# Patient Record
Sex: Female | Born: 1995 | Race: White | Hispanic: No | Marital: Married | Smoking: Never smoker
Health system: Southern US, Community
[De-identification: ages and names within clinical notes are randomized; demographics above are authoritative.]

## PROBLEM LIST (undated history)

## (undated) DIAGNOSIS — R519 Headache, unspecified: Secondary | ICD-10-CM

## (undated) DIAGNOSIS — R002 Palpitations: Secondary | ICD-10-CM

## (undated) HISTORY — PX: NO PAST SURGERIES: SHX2092

## (undated) HISTORY — PX: OTHER SURGICAL HISTORY: SHX169

## (undated) HISTORY — DX: Headache, unspecified: R51.9

## (undated) HISTORY — DX: Palpitations: R00.2

---
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
## 2017-01-16 ENCOUNTER — Ambulatory Visit (INDEPENDENT_AMBULATORY_CARE_PROVIDER_SITE_OTHER): Payer: BLUE CROSS/BLUE SHIELD

## 2017-01-16 ENCOUNTER — Other Ambulatory Visit: Payer: Self-pay

## 2017-01-16 DIAGNOSIS — R000 Tachycardia, unspecified: Secondary | ICD-10-CM

## 2017-01-17 ENCOUNTER — Telehealth: Payer: Self-pay | Admitting: *Deleted

## 2017-01-17 NOTE — Telephone Encounter (Signed)
Notes recorded by Lesle ChrisHill, Prosper Paff G, LPN on 1/19/14788/03/2017 at 3:52 PM EDT Patient notified. Copy to pmd.  -----  Notes recorded by Laqueta LindenKoneswaran, Suresh A, MD on 01/16/2017 at 4:51 PM EDT Normal.

## 2017-01-29 ENCOUNTER — Telehealth: Payer: Self-pay | Admitting: *Deleted

## 2017-01-29 MED ORDER — DILTIAZEM HCL 30 MG PO TABS: 30.0000 mg | ORAL_TABLET | ORAL | 2 refills | Status: DC | PRN

## 2017-01-29 NOTE — Telephone Encounter (Signed)
Notes recorded by Lesle Chris, LPN on 6/80/3212 at 12:53 PM EDT Patient notified. Copy pmd. New medication sent to [facility] now. Follow up already scheduled for 02/04/2017. ------  Notes recorded by Lesle Chris, LPN on 2/48/2500 at 5:34 PM EDT Left message to return call.  ------  Notes recorded by Laqueta Linden, MD on 01/21/2017 at 5:10 PM EDT Start diltiazem 30 mg prn for palpitations.

## 2017-02-04 ENCOUNTER — Ambulatory Visit (INDEPENDENT_AMBULATORY_CARE_PROVIDER_SITE_OTHER): Payer: BLUE CROSS/BLUE SHIELD | Admitting: Cardiovascular Disease

## 2017-02-04 ENCOUNTER — Encounter: Payer: Self-pay | Admitting: Cardiovascular Disease

## 2017-02-04 VITALS — BP 108/75 | HR 108 | Ht 62.0 in | Wt 141.0 lb

## 2017-02-04 DIAGNOSIS — R079 Chest pain, unspecified: Secondary | ICD-10-CM

## 2017-02-04 DIAGNOSIS — R0602 Shortness of breath: Secondary | ICD-10-CM

## 2017-02-04 DIAGNOSIS — R002 Palpitations: Secondary | ICD-10-CM | POA: Diagnosis not present

## 2017-02-04 DIAGNOSIS — R000 Tachycardia, unspecified: Secondary | ICD-10-CM | POA: Diagnosis not present

## 2017-02-04 MED ORDER — DILTIAZEM HCL 30 MG PO TABS: 30.0000 mg | ORAL_TABLET | Freq: Every day | ORAL | Status: DC

## 2017-02-04 NOTE — Progress Notes (Signed)
SUBJECTIVE: The patient returns for follow-up after undergoing cardiovascular testing performed for the evaluation of chest pain and palpitations.  Echocardiography was normal, LVEF 60-65%.  Event monitoring demonstrated sinus tachycardia and sinus rhythm with occasional PVCs. Symptoms correlated with both sinus tachycardia and sinus rhythm.  I then started diltiazem 30 mg as needed for palpitations.  She has yet to begin taking diltiazem she was uncertain as to when to take it. She continues to have left-sided chest pain and sternal and breast tenderness. She denies chest wall and breast discoloration and nipple discharge. She continues to have significant palpitations. She denies leg swelling, lightheadedness, and dizziness.     Review of Systems: As per "subjective", otherwise negative.  Allergies  Allergen Reactions  . Melatonin Itching and Rash    Current Outpatient Prescriptions  Medication Sig Dispense Refill  . diltiazem (CARDIZEM) 30 MG tablet Take 1 tablet (30 mg total) by mouth as needed (palpitations). 30 tablet 2  . ibuprofen (ADVIL,MOTRIN) 200 MG tablet Take 400 mg by mouth every 6 (six) hours as needed.    . mirtazapine (REMERON) 15 MG tablet Take 1 tablet by mouth at bedtime as needed.    . propranolol (INDERAL) 20 MG tablet Take 20-40 mg by mouth daily.      No current facility-administered medications for this visit.     Past Medical History:  Diagnosis Date  . Palpitations     Past Surgical History:  Procedure Laterality Date  . NO PAST SURGERIES      Social History   Social History  . Marital status: Married    Spouse name: N/A  . Number of children: N/A  . Years of education: N/A   Occupational History  . Not on file.   Social History Main Topics  . Smoking status: Never Smoker  . Smokeless tobacco: Never Used  . Alcohol use No  . Drug use: No  . Sexual activity: Not on file   Other Topics Concern  . Not on file   Social  History Narrative  . No narrative on file     Vitals:   02/04/17 0835  BP: 108/75  Pulse: (!) 108  Weight: 141 lb (64 kg)  Height: 5\' 2"  (1.575 m)    Wt Readings from Last 3 Encounters:  02/04/17 141 lb (64 kg) (72 %, Z= 0.58)*  12/23/16 142 lb 6.4 oz (64.6 kg) (74 %, Z= 0.64)*  10/05/16 140 lb (63.5 kg) (72 %, Z= 0.58)*   * Growth percentiles are based on CDC 2-20 Years data.     PHYSICAL EXAM General: NAD HEENT: Normal. Neck: No JVD, no thyromegaly. Lungs: Clear to auscultation bilaterally with normal respiratory effort. CV: Nondisplaced PMI.  Tachycardic, regular rhythm, +chest wall and left breast tenderness, normal S1/S2, no S3/S4, no murmur. No pretibial or periankle edema.  No carotid bruit.   Abdomen: Soft, nontender, no distention.  Neurologic: Alert and oriented.  Psych: Normal affect. Skin: Normal. Musculoskeletal: No gross deformities.    ECG: Most recent ECG reviewed.   Labs: Lab Results  Component Value Date/Time   K 3.7 10/05/2016 05:38 PM   BUN 8 10/05/2016 05:38 PM   CREATININE 0.81 10/05/2016 05:38 PM   ALT 11 (L) 10/05/2016 05:38 PM   TSH 1.248 10/05/2016 05:38 PM   HGB 13.6 10/05/2016 05:38 PM     Lipids: No results found for: LDLCALC, LDLDIRECT, CHOL, TRIG, HDL     ASSESSMENT AND PLAN:  1. Tachycardia and  palpitations with chest pain and shortness of breath: She has both chest wall tenderness and chest pain with palpitations. 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 I've instructed her to take diltiazem 30 mg every evening and to call me back in a week to let me know if symptoms are better, the same, or worse. If diltiazem causes too much of a drop in blood pressure, I may switch to ivabradine.     Disposition: Follow up 1 month.   Prentice Docker, M.D., F.A.C.C.

## 2017-02-04 NOTE — Patient Instructions (Signed)
Medication Instructions:   Increase Cardizem to 36m at bedtime instead of as needed.  Continue all other medications.    Labwork:  ESR (sed rate) - order given today.  Office will contact with results via phone or letter.    Testing/Procedures: none  Follow-Up: 1 month   Any Other Special Instructions Will Be Listed Below (If Applicable). Call the office in 1 week with update on how doing.   If you need a refill on your cardiac medications before your next appointment, please call your pharmacy.

## 2017-02-28 ENCOUNTER — Ambulatory Visit: Payer: BLUE CROSS/BLUE SHIELD | Admitting: Cardiovascular Disease

## 2018-02-17 ENCOUNTER — Ambulatory Visit: Payer: BLUE CROSS/BLUE SHIELD | Admitting: Diagnostic Neuroimaging

## 2018-02-17 ENCOUNTER — Encounter: Payer: Self-pay | Admitting: Diagnostic Neuroimaging

## 2018-02-17 VITALS — BP 114/71 | HR 98 | Ht 62.0 in | Wt 129.6 lb

## 2018-02-17 DIAGNOSIS — G43109 Migraine with aura, not intractable, without status migrainosus: Secondary | ICD-10-CM

## 2018-02-17 MED ORDER — TOPIRAMATE 50 MG PO TABS: 50.0000 mg | ORAL_TABLET | Freq: Two times a day (BID) | ORAL | 12 refills | Status: DC

## 2018-02-17 MED ORDER — RIZATRIPTAN BENZOATE 10 MG PO TBDP: 10.0000 mg | ORAL_TABLET | ORAL | 11 refills | Status: DC | PRN

## 2018-02-17 NOTE — Patient Instructions (Addendum)
To prevent or relieve headaches, try the following:   Cool Compress. Lie down and place a cool compress on your head.   Avoid headache triggers. If certain foods or odors seem to have triggered your migraines in the past, avoid them. A headache diary might help you identify triggers.   Include physical activity in your daily routine.   Manage stress. Find healthy ways to cope with the stressors, such as delegating tasks on your to-do list.   Practice relaxation techniques. Try deep breathing, yoga, massage and visualization.   Eat regularly. Eating regularly scheduled meals and maintaining a healthy diet might help prevent headaches. Also, drink plenty of fluids.   Follow a regular sleep schedule. Sleep deprivation might contribute to headaches  Consider biofeedback. With this mind-body technique, you learn to control certain bodily functions - such as muscle tension, heart rate and blood pressure - to prevent headaches or reduce headache pain.  MIGRAINE WITH AURA  - start topiramate 50mg  at bedtime; after 1 week increase to twice a day; drink plenty of water - rizatriptan 10mg  as needed for breakthrough headache; may repeat x 1 after 2 hours; max 2 tabs per day or 8 per month  PINEAL CYST (likely incidental finding) - will monitor; repeat MRI in 6-12 months

## 2018-02-17 NOTE — Progress Notes (Signed)
[facility]  PATIENT: Marilyn Chapman DOB: 16-Mar-1996  REFERRING CLINICIAN: T Daniel HISTORY FROM: patient  REASON FOR VISIT: new consult    HISTORICAL  CHIEF COMPLAINT:  Chief Complaint  Patient presents with  . New Patient (Initial Visit)    Rm 7, alone  . Diploplia R eye, headaches    Dr. Donzetta Sprung.  Had MRI [facility] report in referral notes.     HISTORY OF PRESENT ILLNESS:   22 year old female here for evaluation of headaches.  Since age 57 years old patient has had intermittent headaches with pounding throbbing sensation, nausea, photophobia, dizziness.  Headaches then resolved on their own.  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  Headaches are similar in quality as when she was 22 years old.  Sometimes she feels a tearing and right eye swelling with headaches.  Sometimes she has double vision with the headaches.  Headaches are occurring almost on a daily basis.  Patient was tried on propranolol without relief.  She tried sumatriptan without relief.  Positive family history of migraine in her mother.   REVIEW OF SYSTEMS: Full 14 system review of systems performed and negative with exception of: Headache dizziness double vision eye pain fatigue ringing in ears.  ALLERGIES: Allergies  Allergen Reactions  . Melatonin Itching and Rash    HOME MEDICATIONS: Outpatient Medications Prior to Visit  Medication Sig Dispense Refill  . diltiazem (CARDIZEM) 30 MG tablet Take 1 tablet (30 mg total) by mouth at bedtime.    Marland Kitchen ibuprofen (ADVIL,MOTRIN) 200 MG tablet Take 400 mg by mouth every 6 (six) hours as needed.    . propranolol (INDERAL) 20 MG tablet Take 20-40 mg by mouth daily.     . mirtazapine (REMERON) 15 MG tablet Take 1 tablet by mouth at bedtime as needed.     No facility-administered medications prior to visit.     PAST MEDICAL HISTORY: Past Medical History:    Diagnosis Date  . Palpitations     PAST SURGICAL HISTORY: Past Surgical History:  Procedure Laterality Date  . cholecystectomy    . NO PAST SURGERIES      FAMILY HISTORY: Family History  Problem Relation Age of Onset  . Hypertension Father   . Hyperlipidemia Maternal Grandmother   . Heart murmur Maternal Grandfather     SOCIAL HISTORY: Social History   Socioeconomic History  . Marital status: Married    Spouse name: Not on file  . Number of children: Not on file  . Years of education: Not on file  . Highest education level: Not on file  Occupational History  . Not on file  Social Needs  . Financial resource strain: Not on file  . Food insecurity:    Worry: Not on file    Inability: Not on file  . Transportation needs:    Medical: Not on file    Non-medical: Not on file  Tobacco Use  . Smoking status: Never Smoker  . Smokeless tobacco: Never Used  Substance and Sexual Activity  . Alcohol use: No  . Drug use: No  . Sexual activity: Not on file  Lifestyle  . Physical activity:    Days per week: Not on file    Minutes per session: Not on file  . Stress: Not on file  Relationships  . Social connections:    Talks on phone: Not on file    Gets together: Not  on file    Attends religious service: Not on file    Active member of club or organization: Not on file    Attends meetings of clubs or organizations: Not on file    Relationship status: Not on file  . Intimate partner violence:    Fear of current or ex partner: Not on file    Emotionally abused: Not on file    Physically abused: Not on file    Forced sexual activity: Not on file  Other Topics Concern  . Not on file  Social History Narrative   Lives home with husband and child.  She is homemaker.  HS Graduate.  Drinks 7 cans soda daily.     PHYSICAL EXAM  GENERAL EXAM/CONSTITUTIONAL: Vitals:  Vitals:   02/17/18 1424  BP: 114/71  Pulse: 98  Weight: 129 lb 9.6 oz (58.8 kg)  Height: 5\' 2"   (1.575 m)     Body mass index is 23.7 kg/m. Wt Readings from Last 3 Encounters:  02/17/18 129 lb 9.6 oz (58.8 kg)  02/04/17 141 lb (64 kg) (72 %, Z= 0.58)*  12/23/16 142 lb 6.4 oz (64.6 kg) (74 %, Z= 0.64)*   * Growth percentiles are based on CDC (Girls, 2-20 Years) data.     Patient is in no distress; well developed, nourished and groomed; neck is supple  CARDIOVASCULAR:  Examination of carotid arteries is normal; no carotid bruits  Regular rate and rhythm, no murmurs  Examination of peripheral vascular system by observation and palpation is normal  EYES:  Ophthalmoscopic exam of optic discs and posterior segments is normal; no papilledema or hemorrhages  Visual Acuity Screening   Right eye Left eye Both eyes  Without correction:     With correction: 20/20 20/30      MUSCULOSKELETAL:  Gait, strength, tone, movements noted in Neurologic exam below  NEUROLOGIC: MENTAL STATUS:  No flowsheet data found.  awake, alert, oriented to person, place and time  recent and remote memory intact  normal attention and concentration  language fluent, comprehension intact, naming intact  fund of knowledge appropriate  CRANIAL NERVE:   2nd - no papilledema on fundoscopic exam  2nd, 3rd, 4th, 6th - pupils equal and reactive to light, visual fields full to confrontation, extraocular muscles intact, no nystagmus  5th - facial sensation symmetric  7th - facial strength symmetric  8th - hearing intact  9th - palate elevates symmetrically, uvula midline  11th - shoulder shrug symmetric  12th - tongue protrusion midline  MOTOR:   normal bulk and tone, full strength in the BUE, BLE  SENSORY:   normal and symmetric to light touch, temperature, vibration  COORDINATION:   finger-nose-finger, fine finger movements normal  REFLEXES:   deep tendon reflexes present and symmetric  GAIT/STATION:   narrow based gait; able to walk tandem     DIAGNOSTIC DATA  (LABS, IMAGING, TESTING) - I reviewed patient records, labs, notes, testing and imaging myself where available.  Lab Results  Component Value Date   WBC 8.0 10/05/2016   HGB 13.6 10/05/2016   HCT 38.9 10/05/2016   MCV 86.4 10/05/2016   PLT 240 10/05/2016      Component Value Date/Time   NA 137 10/05/2016 1738   K 3.7 10/05/2016 1738   CL 106 10/05/2016 1738   CO2 24 10/05/2016 1738   GLUCOSE 108 (H) 10/05/2016 1738   BUN 8 10/05/2016 1738   CREATININE 0.81 10/05/2016 1738   CALCIUM 9.0 10/05/2016 1738  PROT 6.4 (L) 10/05/2016 1738   ALBUMIN 3.9 10/05/2016 1738   AST 20 10/05/2016 1738   ALT 11 (L) 10/05/2016 1738   ALKPHOS 55 10/05/2016 1738   BILITOT 0.4 10/05/2016 1738   GFRNONAA >60 10/05/2016 1738   GFRAA >60 10/05/2016 1738   No results found for: CHOL, HDL, LDLCALC, LDLDIRECT, TRIG, CHOLHDL No results found for: BMWU1L No results found for: VITAMINB12 Lab Results  Component Value Date   TSH 1.248 10/05/2016    02/19/16 CT head, sinus, cervical spine [I reviewed images myself and agree with interpretation. -VRP]  1. No acute intracranial abnormality is identified. Normal CT of head. 2. No maxillofacial fracture. Mild soft tissue swelling overlying the left cheek and left lateral orbital rim consistent with contusion. 3. No fracture or dislocation of the cervical spine.  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   ASSESSMENT AND PLAN  22 y.o. year old female here with migraine with aura, since age 66 years old.  Will proceed with migraine treatment.    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  Will repeat MRI in 6 to 12 months to ensure stability.  Not likely a severe finding.   Dx:  1. Migraine with aura and without status migrainosus, not intractable     PLAN:  MIGRAINE WITH AURA  - start topiramate 50mg  at bedtime; after 1 week increase to twice a day; drink plenty of water - rizatriptan 10mg  as needed for  breakthrough headache; may repeat x 1 after 2 hours; max 2 tabs per day or 8 per month  PINEAL CYST (likely incidental finding) - will monitor; repeat MRI in 6-12 months   Meds ordered this encounter  Medications  . topiramate (TOPAMAX) 50 MG tablet    Sig: Take 1 tablet (50 mg total) by mouth 2 (two) times daily.    Dispense:  60 tablet    Refill:  12  . rizatriptan (MAXALT-MLT) 10 MG disintegrating tablet    Sig: Take 1 tablet (10 mg total) by mouth as needed for migraine. May repeat in 2 hours if needed    Dispense:  9 tablet    Refill:  11   Return in about 4 months (around 06/19/2018).    Suanne Marker, MD 02/17/2018, 2:48 PM Certified in Neurology, Neurophysiology and Neuroimaging  [facility] [address] [place], [place] [address] [phone]
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
## 2018-06-29 ENCOUNTER — Ambulatory Visit: Payer: BLUE CROSS/BLUE SHIELD | Admitting: Diagnostic Neuroimaging

## 2018-06-29 ENCOUNTER — Telehealth: Payer: Self-pay | Admitting: *Deleted

## 2018-06-29 NOTE — Telephone Encounter (Signed)
Pt no showed for appt today.  

## 2018-06-29 NOTE — Progress Notes (Deleted)
[facility]  PATIENT: Marilyn DownsHannah Rhea Chapman DOB: August 16, 1995  REFERRING CLINICIAN: T Daniel HISTORY FROM: patient  REASON FOR VISIT: new consult    HISTORICAL  CHIEF COMPLAINT:  No chief complaint on file.   HISTORY OF PRESENT ILLNESS:   UPDATE (06/29/2018, VRP):   HPI: 23 year old female here for evaluation of headaches. Since age 23 years old patient has had intermittent headaches with pounding throbbing sensation, nausea, photophobia, dizziness.  Headaches then resolved on their own.  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  Headaches are similar in quality as when she was 23 years old.  Sometimes she feels a tearing and right eye swelling with headaches.  Sometimes she has double vision with the headaches.  Headaches are occurring almost on a daily basis.  Patient was tried on propranolol without relief.  She tried sumatriptan without relief. Positive family history of migraine in her mother.   REVIEW OF SYSTEMS: Full 14 system review of systems performed and negative with exception of: Headache dizziness double vision eye pain fatigue ringing in ears.  ALLERGIES: Allergies  Allergen Reactions  . Melatonin Itching and Rash    HOME MEDICATIONS: Outpatient Medications Prior to Visit  Medication Sig Dispense Refill  . rizatriptan (MAXALT-MLT) 10 MG disintegrating tablet Take 1 tablet (10 mg total) by mouth as needed for migraine. May repeat in 2 hours if needed 9 tablet 11  . topiramate (TOPAMAX) 50 MG tablet Take 1 tablet (50 mg total) by mouth 2 (two) times daily. 60 tablet 12   No facility-administered medications prior to visit.     PAST MEDICAL HISTORY: Past Medical History:  Diagnosis Date  . Palpitations     PAST SURGICAL HISTORY: Past Surgical History:  Procedure Laterality Date  . cholecystectomy    . NO PAST SURGERIES      FAMILY HISTORY: Family History  Problem  Relation Age of Onset  . Hypertension Father   . Hyperlipidemia Maternal Grandmother   . Heart murmur Maternal Grandfather     SOCIAL HISTORY: Social History   Socioeconomic History  . Marital status: Married    Spouse name: Not on file  . Number of children: Not on file  . Years of education: Not on file  . Highest education level: Not on file  Occupational History  . Not on file  Social Needs  . Financial resource strain: Not on file  . Food insecurity:    Worry: Not on file    Inability: Not on file  . Transportation needs:    Medical: Not on file    Non-medical: Not on file  Tobacco Use  . Smoking status: Never Smoker  . Smokeless tobacco: Never Used  Substance and Sexual Activity  . Alcohol use: No  . Drug use: No  . Sexual activity: Not on file  Lifestyle  . Physical activity:    Days per week: Not on file    Minutes per session: Not on file  . Stress: Not on file  Relationships  . Social connections:    Talks on phone: Not on file    Gets together: Not on file    Attends religious service: Not on file    Active member of club or organization: Not on file    Attends meetings of clubs or organizations: Not on file    Relationship status: Not on file  . Intimate partner violence:    Fear of current  or ex partner: Not on file    Emotionally abused: Not on file    Physically abused: Not on file    Forced sexual activity: Not on file  Other Topics Concern  . Not on file  Social History Narrative   Lives home with husband and child.  She is homemaker.  HS Graduate.  Drinks 7 cans soda daily.     PHYSICAL EXAM  GENERAL EXAM/CONSTITUTIONAL: Vitals:  There were no vitals filed for this visit. There is no height or weight on file to calculate BMI. Wt Readings from Last 3 Encounters:  02/17/18 129 lb 9.6 oz (58.8 kg)  02/04/17 141 lb (64 kg) (72 %, Z= 0.58)*  12/23/16 142 lb 6.4 oz (64.6 kg) (74 %, Z= 0.64)*   * Growth percentiles are based on CDC  (Girls, 2-20 Years) data.    Patient is in no distress; well developed, nourished and groomed; neck is supple  CARDIOVASCULAR:  Examination of carotid arteries is normal; no carotid bruits  Regular rate and rhythm, no murmurs  Examination of peripheral vascular system by observation and palpation is normal  EYES:  Ophthalmoscopic exam of optic discs and posterior segments is normal; no papilledema or hemorrhages No exam data present  MUSCULOSKELETAL:  Gait, strength, tone, movements noted in Neurologic exam below  NEUROLOGIC: MENTAL STATUS:  No flowsheet data found.  awake, alert, oriented to person, place and time  recent and remote memory intact  normal attention and concentration  language fluent, comprehension intact, naming intact  fund of knowledge appropriate  CRANIAL NERVE:   2nd - no papilledema on fundoscopic exam  2nd, 3rd, 4th, 6th - pupils equal and reactive to light, visual fields full to confrontation, extraocular muscles intact, no nystagmus  5th - facial sensation symmetric  7th - facial strength symmetric  8th - hearing intact  9th - palate elevates symmetrically, uvula midline  11th - shoulder shrug symmetric  12th - tongue protrusion midline  MOTOR:   normal bulk and tone, full strength in the BUE, BLE  SENSORY:   normal and symmetric to light touch, temperature, vibration  COORDINATION:   finger-nose-finger, fine finger movements normal  REFLEXES:   deep tendon reflexes present and symmetric  GAIT/STATION:   narrow based gait; able to walk tandem     DIAGNOSTIC DATA (LABS, IMAGING, TESTING) - I reviewed patient records, labs, notes, testing and imaging myself where available.  Lab Results  Component Value Date   WBC 8.0 10/05/2016   HGB 13.6 10/05/2016   HCT 38.9 10/05/2016   MCV 86.4 10/05/2016   PLT 240 10/05/2016      Component Value Date/Time   NA 137 10/05/2016 1738   K 3.7 10/05/2016 1738   CL 106  10/05/2016 1738   CO2 24 10/05/2016 1738   GLUCOSE 108 (H) 10/05/2016 1738   BUN 8 10/05/2016 1738   CREATININE 0.81 10/05/2016 1738   CALCIUM 9.0 10/05/2016 1738   PROT 6.4 (L) 10/05/2016 1738   ALBUMIN 3.9 10/05/2016 1738   AST 20 10/05/2016 1738   ALT 11 (L) 10/05/2016 1738   ALKPHOS 55 10/05/2016 1738   BILITOT 0.4 10/05/2016 1738   GFRNONAA >60 10/05/2016 1738   GFRAA >60 10/05/2016 1738   No results found for: CHOL, HDL, LDLCALC, LDLDIRECT, TRIG, CHOLHDL No results found for: GYBW3S No results found for: VITAMINB12 Lab Results  Component Value Date   TSH 1.248 10/05/2016    02/19/16 CT head, sinus, cervical spine [I reviewed images  myself and agree with interpretation. -VRP]  1. No acute intracranial abnormality is identified. Normal CT of head. 2. No maxillofacial fracture. Mild soft tissue swelling overlying the left cheek and left lateral orbital rim consistent with contusion. 3. No fracture or dislocation of the cervical spine.  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   ASSESSMENT AND PLAN  23 y.o. year old female here with migraine with aura, since age 22 years old.    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  Will repeat MRI in 6 to 12 months to ensure stability.  Not likely a severe finding.   Dx:  No diagnosis found.  PLAN:  MIGRAINE WITH AURA  - continue topiramate 50mg  twice a day; drink plenty of water - continue rizatriptan 10mg  as needed for breakthrough headache; may repeat x 1 after 2 hours; max 2 tabs per day or 8 per month  PINEAL CYST (likely incidental finding) - will monitor; repeat MRI in 6-12 months   No orders of the defined types were placed in this encounter.  No follow-ups on file.    Suanne Marker, MD 06/29/2018, 3:11 PM Certified in Neurology, Neurophysiology and Neuroimaging  [facility] [address], [address] [place], [place] [address] [phone]

## 2018-06-30 ENCOUNTER — Encounter: Payer: Self-pay | Admitting: Diagnostic Neuroimaging

## 2018-07-07 NOTE — Progress Notes (Deleted)
Psychiatric Initial Adult Assessment   Patient Identification: Marilyn Chapman MRN:  [id number] Date of Evaluation:  07/07/2018 Referral Source: *** Chief Complaint:   Visit Diagnosis: No diagnosis found.  History of Present Illness:   Marilyn Chapman is a 24 y.o. year old female with a history of postpartum depression, migraine, who is referred for depression.    Associated Signs/Symptoms: Depression Symptoms:  {DEPRESSION SYMPTOMS:20000} (Hypo) Manic Symptoms:  {BHH MANIC SYMPTOMS:22872} Anxiety Symptoms:  {BHH ANXIETY SYMPTOMS:22873} Psychotic Symptoms:  {BHH PSYCHOTIC SYMPTOMS:22874} PTSD Symptoms: {BHH PTSD SYMPTOMS:22875}  Past Psychiatric History:  Outpatient:  Psychiatry admission:  Previous suicide attempt:  Past trials of medication:  History of violence:   Previous Psychotropic Medications: {YES/NO:21197}  Substance Abuse History in the last 12 months:  {yes no:314532}  Consequences of Substance Abuse: {BHH CONSEQUENCES OF SUBSTANCE ABUSE:22880}  Past Medical History:  Past Medical History:  Diagnosis Date  . Palpitations     Past Surgical History:  Procedure Laterality Date  . cholecystectomy    . NO PAST SURGERIES      Family Psychiatric History: ***  Family History:  Family History  Problem Relation Age of Onset  . Hypertension Father   . Hyperlipidemia Maternal Grandmother   . Heart murmur Maternal Grandfather     Social History:   Social History   Socioeconomic History  . Marital status: Married    Spouse name: Not on file  . Number of children: Not on file  . Years of education: Not on file  . Highest education level: Not on file  Occupational History  . Not on file  Social Needs  . Financial resource strain: Not on file  . Food insecurity:    Worry: Not on file    Inability: Not on file  . Transportation needs:    Medical: Not on file    Non-medical: Not on file  Tobacco Use  . Smoking status: Never Smoker  . Smokeless  tobacco: Never Used  Substance and Sexual Activity  . Alcohol use: No  . Drug use: No  . Sexual activity: Not on file  Lifestyle  . Physical activity:    Days per week: Not on file    Minutes per session: Not on file  . Stress: Not on file  Relationships  . Social connections:    Talks on phone: Not on file    Gets together: Not on file    Attends religious service: Not on file    Active member of club or organization: Not on file    Attends meetings of clubs or organizations: Not on file    Relationship status: Not on file  Other Topics Concern  . Not on file  Social History Narrative   Lives home with husband and child.  She is homemaker.  HS Graduate.  Drinks 7 cans soda daily.    Additional Social History: ***  Allergies:   Allergies  Allergen Reactions  . Melatonin Itching and Rash    Metabolic Disorder Labs: No results found for: HGBA1C, MPG No results found for: PROLACTIN No results found for: CHOL, TRIG, HDL, CHOLHDL, VLDL, LDLCALC Lab Results  Component Value Date   TSH 1.248 10/05/2016    Therapeutic Level Labs: No results found for: LITHIUM No results found for: CBMZ No results found for: VALPROATE  Current Medications: Current Outpatient Medications  Medication Sig Dispense Refill  . rizatriptan (MAXALT-MLT) 10 MG disintegrating tablet Take 1 tablet (10 mg total) by mouth as needed for migraine.  May repeat in 2 hours if needed 9 tablet 11  . topiramate (TOPAMAX) 50 MG tablet Take 1 tablet (50 mg total) by mouth 2 (two) times daily. 60 tablet 12   No current facility-administered medications for this visit.     Musculoskeletal: Strength & Muscle Tone: within normal limits Gait & Station: normal Patient leans: N/A  Psychiatric Specialty Exam: ROS  There were no vitals taken for this visit.There is no height or weight on file to calculate BMI.  General Appearance: Fairly Groomed  Eye Contact:  Good  Speech:  Clear and Coherent  Volume:   Normal  Mood:  {BHH MOOD:22306}  Affect:  {Affect (PAA):22687}  Thought Process:  Coherent  Orientation:  Full (Time, Place, and Person)  Thought Content:  Logical  Suicidal Thoughts:  {ST/HT (PAA):22692}  Homicidal Thoughts:  {ST/HT (PAA):22692}  Memory:  Immediate;   Good  Judgement:  {Judgement (PAA):22694}  Insight:  {Insight (PAA):22695}  Psychomotor Activity:  Normal  Concentration:  Concentration: Good and Attention Span: Good  Recall:  Good  Fund of Knowledge:Good  Language: Good  Akathisia:  No  Handed:  Right  AIMS (if indicated):  not done  Assets:  Communication Skills Desire for Improvement  ADL's:  Intact  Cognition: WNL  Sleep:  {BHH GOOD/FAIR/POOR:22877}   Screenings:   Assessment and Plan:  Assessment  Plan  The patient demonstrates the following risk factors for suicide: Chronic risk factors for suicide include: {Chronic Risk Factors for UEAVWUJ:81191478}Suicide:30414011}. Acute risk factors for suicide include: {Acute Risk Factors for GNFAOZH:08657846}Suicide:30414012}. Protective factors for this patient include: {Protective Factors for Suicide NGEX:52841324}Risk:30414013}. Considering these factors, the overall suicide risk at this point appears to be {Desc; low/moderate/high:110033}. Patient {ACTION; IS/IS MWN:02725366}OT:21021397} appropriate for outpatient follow up.    [name], MD 1/28/202012:43 PM

## 2018-07-10 ENCOUNTER — Ambulatory Visit (HOSPITAL_COMMUNITY): Payer: Self-pay | Admitting: Psychiatry

## 2018-12-26 IMAGING — DX DG CHEST 2V
2 series · 2 of 2 positions shown · non-contrast
Comparison: May 14, 2016

CLINICAL DATA: Intermittent left upper chest pain and shortness of
breath. Tachycardia.

EXAM:
CHEST  2 VIEW

[w chest pa]
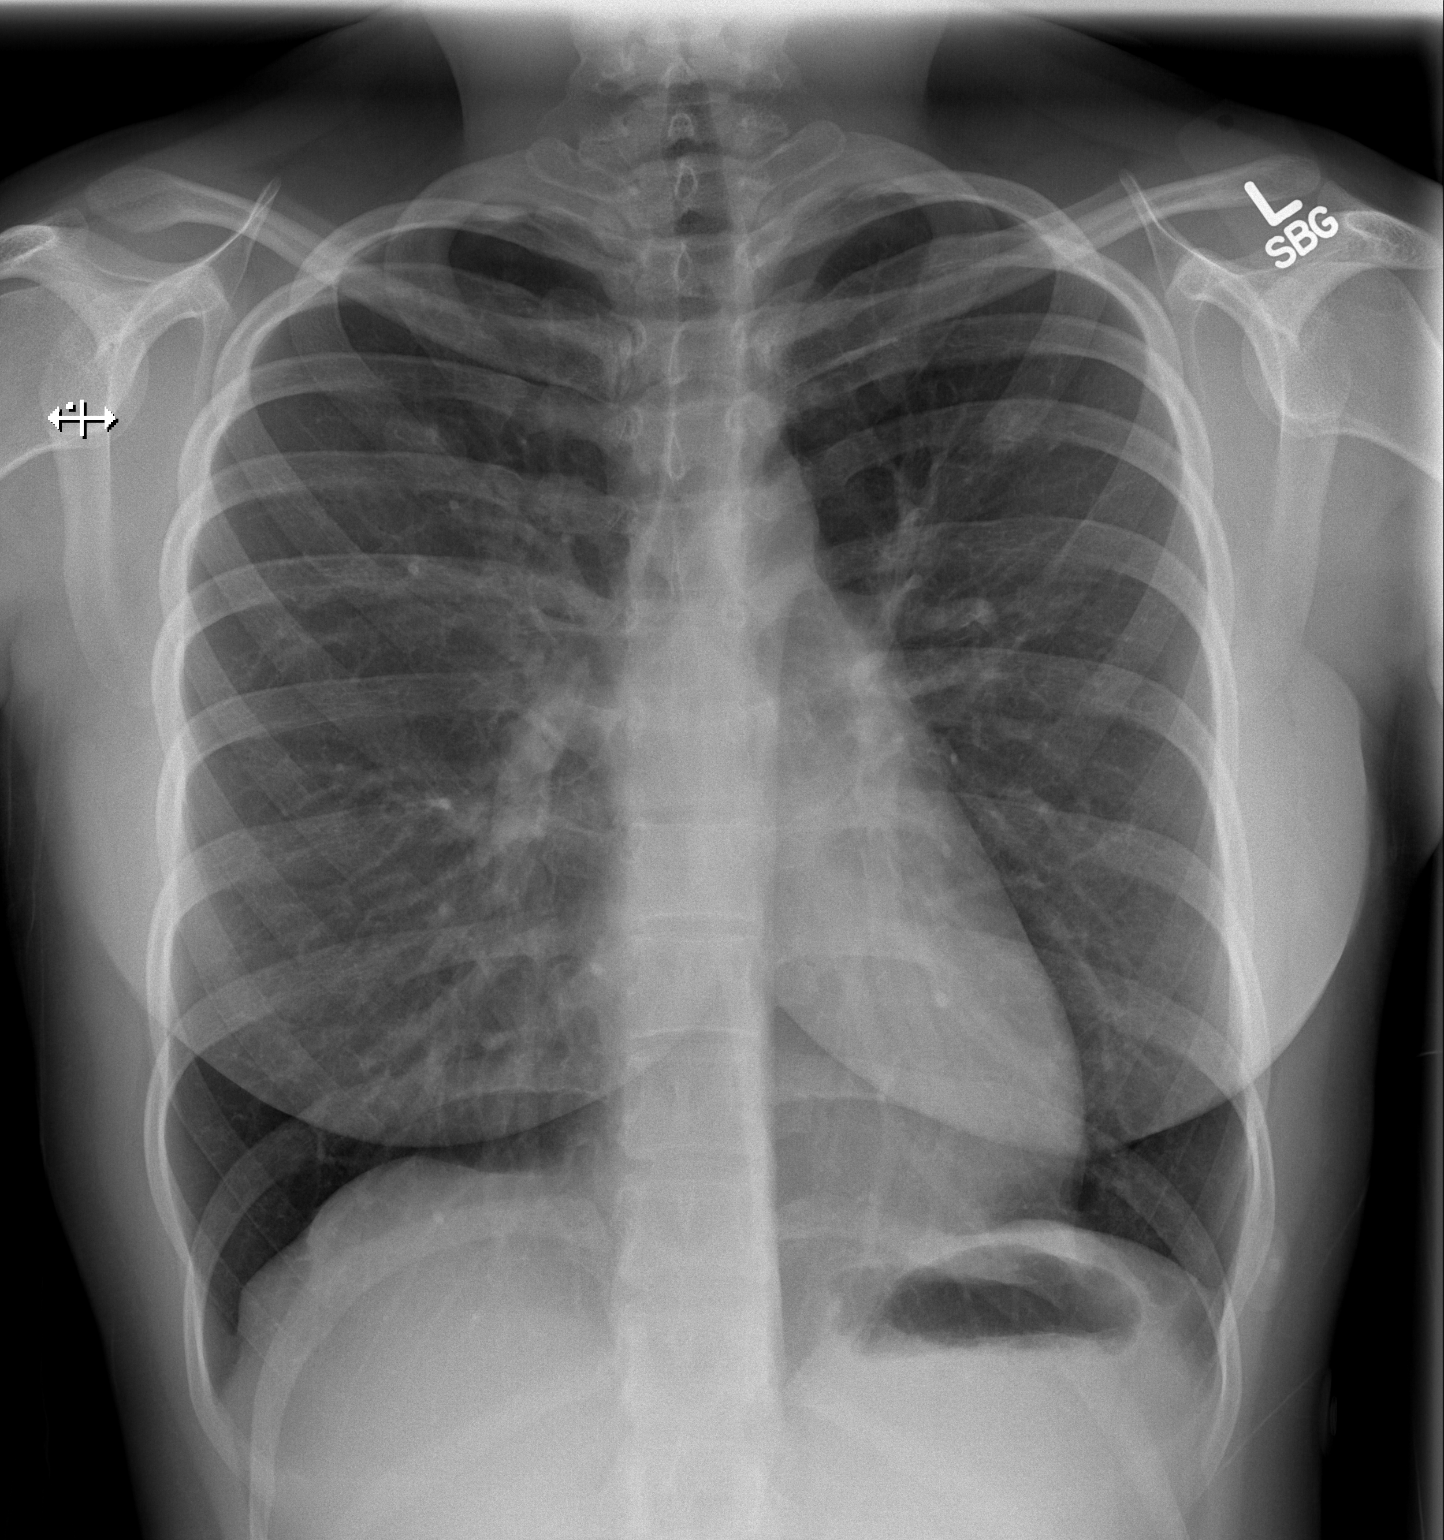

[w chest lat]
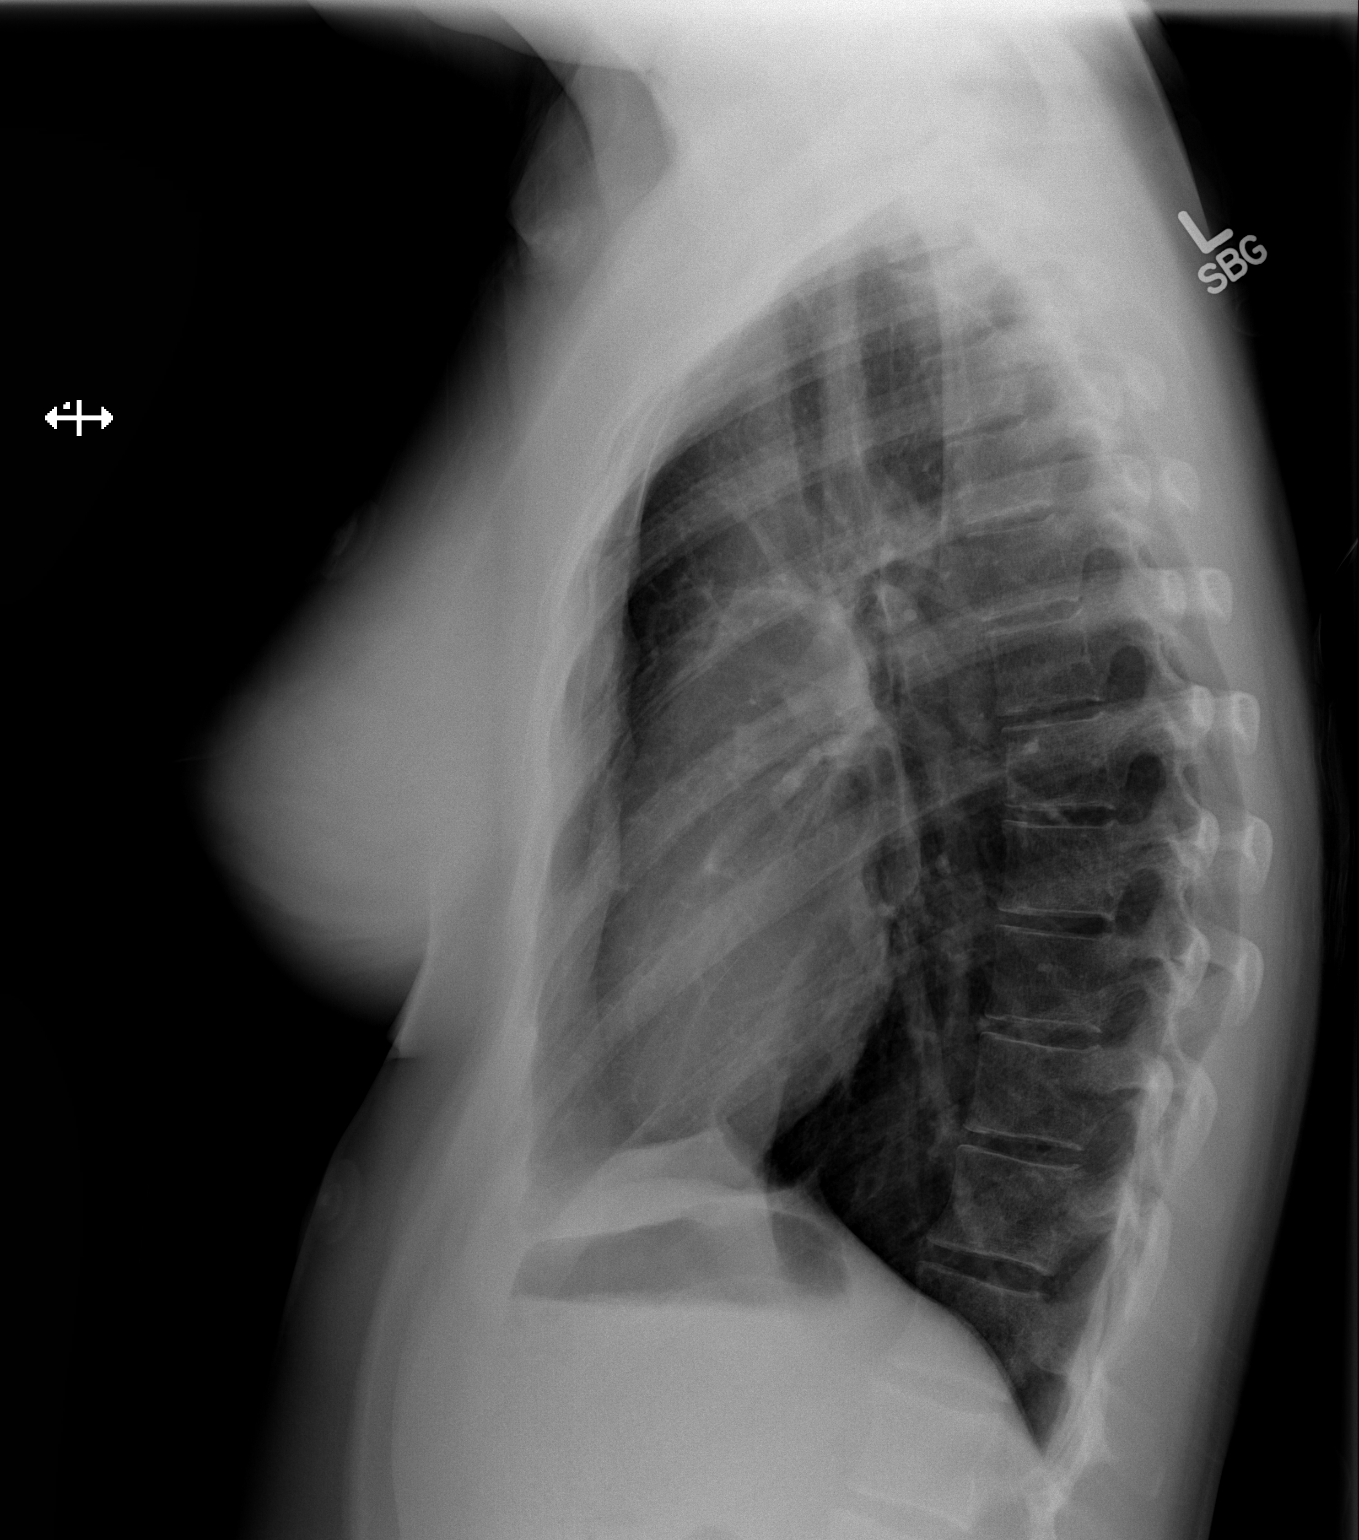

[2 of 2 positions shown; findings below may reference images not displayed]

FINDINGS: The heart, hila, and mediastinum are normal. No pulmonary nodules or
masses. No focal infiltrates.
IMPRESSION: No active cardiopulmonary disease.

## 2019-03-22 ENCOUNTER — Other Ambulatory Visit: Payer: Self-pay | Admitting: Diagnostic Neuroimaging

## 2019-07-27 ENCOUNTER — Other Ambulatory Visit: Payer: Self-pay

## 2019-07-27 ENCOUNTER — Encounter: Payer: Self-pay | Admitting: Diagnostic Neuroimaging

## 2019-07-27 ENCOUNTER — Ambulatory Visit (INDEPENDENT_AMBULATORY_CARE_PROVIDER_SITE_OTHER): Payer: BC Managed Care – PPO | Admitting: Diagnostic Neuroimaging

## 2019-07-27 VITALS — BP 108/68 | HR 88 | Temp 97.8°F | Ht 62.0 in | Wt 132.6 lb

## 2019-07-27 DIAGNOSIS — E348 Other specified endocrine disorders: Secondary | ICD-10-CM

## 2019-07-27 DIAGNOSIS — G43109 Migraine with aura, not intractable, without status migrainosus: Secondary | ICD-10-CM

## 2019-07-27 MED ORDER — AIMOVIG 70 MG/ML ~~LOC~~ SOAJ: 70.0000 mg | SUBCUTANEOUS | 4 refills | Status: DC

## 2019-07-27 MED ORDER — RIZATRIPTAN BENZOATE 10 MG PO TBDP: 10.0000 mg | ORAL_TABLET | ORAL | 11 refills | Status: DC | PRN

## 2019-07-27 NOTE — Progress Notes (Signed)
[facility]  PATIENT: Marilyn Chapman DOB: [birth year]  REFERRING CLINICIAN: T Daniel HISTORY FROM: patient  REASON FOR VISIT: follow up   HISTORICAL  CHIEF COMPLAINT:  Chief Complaint  Patient presents with  . Migraine    rm 7 "stopped topiramate becuase it made me feel weird, can't drive that way; having headaches/migraines daily; rizatriptan helps but only take when it's unbearable, but it doesn't take it completely away, sometimes take Ibuprofen for light headache"    HISTORY OF PRESENT ILLNESS:   UPDATE (07/27/19, VRP): Since last visit, doing well until last 5 months, then almost daily headaches with migraine features. Stopped TPX due to lightheaded feeling. RIzatriptan still helps. Now off caffeine (no change in HA). No alleviating or aggravating factors.     PRIOR HPI: 24 year old female here for evaluation of headaches.  Since age 58 years old patient has had intermittent headaches with pounding throbbing sensation, nausea, photophobia, dizziness.  Headaches then resolved on their own.  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  Headaches are similar in quality as when she was 24 years old.  Sometimes she feels a tearing and right eye swelling with headaches.  Sometimes she has double vision with the headaches.  Headaches are occurring almost on a daily basis.  Patient was tried on propranolol without relief.  She tried sumatriptan without relief.  Positive family history of migraine in her mother.   REVIEW OF SYSTEMS: Full 14 system review of systems performed and negative with exception of: as per HPI.   ALLERGIES: Allergies  Allergen Reactions  . Topiramate Other (See Comments)    "makes me feel light headed, weird"  . Melatonin Itching and Rash    HOME MEDICATIONS: Outpatient Medications Prior to Visit  Medication Sig Dispense Refill  . rizatriptan (MAXALT-MLT) 10 MG  disintegrating tablet Take 1 tablet (10 mg total) by mouth as needed for migraine. May repeat in 2 hours if needed 9 tablet 11  . SPRINTEC 28 0.25-35 MG-MCG tablet Take 1 tablet by mouth daily.    Marland Kitchen topiramate (TOPAMAX) 50 MG tablet Take 1 tablet (50 mg total) by mouth 2 (two) times daily. 60 tablet 12   No facility-administered medications prior to visit.    PAST MEDICAL HISTORY: Past Medical History:  Diagnosis Date  . Headache   . Palpitations     PAST SURGICAL HISTORY: Past Surgical History:  Procedure Laterality Date  . cholecystectomy    . NO PAST SURGERIES      FAMILY HISTORY: Family History  Problem Relation Age of Onset  . Hypertension Father   . Hyperlipidemia Maternal Grandmother   . Heart murmur Maternal Grandfather     SOCIAL HISTORY: Social History   Socioeconomic History  . Marital status: Married    Spouse name: Not on file  . Number of children: 1  . Years of education: 83  . 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 Smoking status: Never Smoker  . Smokeless tobacco: Never Used  Substance and Sexual Activity  . Alcohol use: No  . Drug use: No  . Sexual activity: Not on file  Other Topics Concern  . Not on file  Social History Narrative   Lives home with husband and child.  She is homemaker.  HS Graduate.  Drinks 7 cans soda daily.   Social Determinants of Health  Financial Resource Strain:   . Difficulty of Paying Living Expenses: Not on file  Food Insecurity:   . Worried About Charity fundraiser in the Last Year: Not on file  . Ran Out of Food in the Last Year: Not on file  Transportation Needs:   . Lack of Transportation (Medical): Not on file  . Lack of Transportation (Non-Medical): Not on file  Physical Activity:   . Days of Exercise per Week: Not on file  . Minutes of Exercise per Session: Not on file  Stress:   . Feeling of Stress : Not on file  Social Connections:   .  Frequency of Communication with Friends and Family: Not on file  . Frequency of Social Gatherings with Friends and Family: Not on file  . Attends Religious Services: Not on file  . Active Member of Clubs or Organizations: Not on file  . Attends Archivist Meetings: Not on file  . Marital Status: Not on file  Intimate Partner Violence:   . Fear of Current or Ex-Partner: Not on file  . Emotionally Abused: Not on file  . Physically Abused: Not on file  . Sexually Abused: Not on file     PHYSICAL EXAM  GENERAL EXAM/CONSTITUTIONAL: Vitals:  Vitals:   07/27/19 1119  BP: 108/68  Pulse: 88  Temp: 97.8 F (36.6 C)  Weight: 132 lb 9.6 oz (60.1 kg)  Height: 5\' 2"  (1.575 m)   Body mass index is 24.25 kg/m. Wt Readings from Last 3 Encounters:  07/27/19 132 lb 9.6 oz (60.1 kg)  02/17/18 129 lb 9.6 oz (58.8 kg)  02/04/17 141 lb (64 kg) (72 %, Z= 0.58)*   * Growth percentiles are based on CDC (Girls, 2-20 Years) data.    Patient is in no distress; well developed, nourished and groomed; neck is supple  CARDIOVASCULAR:  Examination of carotid arteries is normal; no carotid bruits  Regular rate and rhythm, no murmurs  Examination of peripheral vascular system by observation and palpation is normal  EYES:  Ophthalmoscopic exam of optic discs and posterior segments is normal; no papilledema or hemorrhages No exam data present  MUSCULOSKELETAL:  Gait, strength, tone, movements noted in Neurologic exam below  NEUROLOGIC: MENTAL STATUS:  No flowsheet data found.  awake, alert, oriented to person, place and time  recent and remote memory intact  normal attention and concentration  language fluent, comprehension intact, naming intact  fund of knowledge appropriate  CRANIAL NERVE:   2nd - no papilledema on fundoscopic exam  2nd, 3rd, 4th, 6th - pupils equal and reactive to light, visual fields full to confrontation, extraocular muscles intact, no  nystagmus  5th - facial sensation symmetric  7th - facial strength symmetric  8th - hearing intact  9th - palate elevates symmetrically, uvula midline  11th - shoulder shrug symmetric  12th - tongue protrusion midline  MOTOR:   normal bulk and tone, full strength in the BUE, BLE  SENSORY:   normal and symmetric to light touch  COORDINATION:   finger-nose-finger, fine finger movements normal  REFLEXES:   deep tendon reflexes present and symmetric  GAIT/STATION:   narrow based gait     DIAGNOSTIC DATA (LABS, IMAGING, TESTING) - I reviewed patient records, labs, notes, testing and imaging myself where available.  Lab Results  Component Value Date   WBC 8.0 10/05/2016   HGB 13.6 10/05/2016   HCT 38.9 10/05/2016   MCV 86.4 10/05/2016   PLT 240 10/05/2016  Component Value Date/Time   NA 137 10/05/2016 1738   K 3.7 10/05/2016 1738   CL 106 10/05/2016 1738   CO2 24 10/05/2016 1738   GLUCOSE 108 (H) 10/05/2016 1738   BUN 8 10/05/2016 1738   CREATININE 0.81 10/05/2016 1738   CALCIUM 9.0 10/05/2016 1738   PROT 6.4 (L) 10/05/2016 1738   ALBUMIN 3.9 10/05/2016 1738   AST 20 10/05/2016 1738   ALT 11 (L) 10/05/2016 1738   ALKPHOS 55 10/05/2016 1738   BILITOT 0.4 10/05/2016 1738   GFRNONAA >60 10/05/2016 1738   GFRAA >60 10/05/2016 1738   No results found for: CHOL, HDL, LDLCALC, LDLDIRECT, TRIG, CHOLHDL No results found for: UXNA3F No results found for: VITAMINB12 Lab Results  Component Value Date   TSH 1.248 10/05/2016    02/19/16 CT head, sinus, cervical spine [I reviewed images myself and agree with interpretation. -VRP]  1. No acute intracranial abnormality is identified. Normal CT of head. 2. No maxillofacial fracture. Mild soft tissue swelling overlying the left cheek and left lateral orbital rim consistent with contusion. 3. No fracture or dislocation of the cervical spine.  12/18/17 MRI brain [report only] -Homogenous cystic mass likely  pineal cyst.   ASSESSMENT AND PLAN  24 y.o. year old female here with migraine with aura, since age 77 years old.  Will proceed with migraine treatment.    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  Will repeat MRI in 6 to 12 months to ensure stability. Not likely a severe finding.  Dx:  1. Migraine with aura and without status migrainosus, not intractable   2. Pineal gland cyst     PLAN:  MIGRAINE WITH AURA  - aimovig monthly injections (tried and failed topiramate, propranolol, sumatriptan) - rizatriptan 10mg  as needed for breakthrough headache; may repeat x 1 after 2 hours; max 2 tabs per day or 8 per month  PINEAL CYST (likely incidental finding) - will monitor; repeat MRI  Meds ordered this encounter  Medications  . Erenumab-aooe (AIMOVIG) 70 MG/ML SOAJ    Sig: Inject 70 mg into the skin every 30 (thirty) days.    Dispense:  3 pen    Refill:  4  . rizatriptan (MAXALT-MLT) 10 MG disintegrating tablet    Sig: Take 1 tablet (10 mg total) by mouth as needed for migraine. May repeat in 2 hours if needed    Dispense:  9 tablet    Refill:  11   Orders Placed This Encounter  Procedures  . MR BRAIN W WO CONTRAST   Return in about 8 months (around 03/25/2020) for with NP (Amy Lomax).    03/27/2020, MD 07/27/2019, 11:41 AM Certified in Neurology, Neurophysiology and Neuroimaging  [facility] [address], [address] [place], [place] [phone]

## 2019-07-27 NOTE — Patient Instructions (Signed)
MIGRAINE WITH AURA  - aimovig monthly injections (tried and failed topiramate, propranolol, sumatriptan) - rizatriptan 10mg  as needed for breakthrough headache; may repeat x 1 after 2 hours; max 2 tabs per day or 8 per month  PINEAL CYST (likely incidental finding) - will monitor; repeat MRI

## 2020-03-27 ENCOUNTER — Ambulatory Visit: Payer: BC Managed Care – PPO | Admitting: Family Medicine

## 2020-03-27 ENCOUNTER — Encounter: Payer: Self-pay | Admitting: Family Medicine

## 2020-03-27 NOTE — Progress Notes (Deleted)
No chief complaint on file.    HISTORY OF PRESENT ILLNESS: Today 03/27/20  Marilyn Chapman is a 24 y.o. female here today for follow up for migraines. She continues Amovig 70mg  rizatriptan for abortive therapy    HISTORY (copied from note on 07/27/2019)  UPDATE (07/27/19, VRP): Since last visit, doing well until last 5 months, then almost daily headaches with migraine features. Stopped TPX due to lightheaded feeling. RIzatriptan still helps. Now off caffeine (no change in HA). No alleviating or aggravating factors.     PRIOR HPI: 24 year old female here for evaluation of headaches.  Since age 89 years old patient has had intermittent headaches with pounding throbbing sensation, nausea, photophobia, dizziness.  Headaches then resolved on their own.  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  Headaches are similar in quality as when she was 24 years old.  Sometimes she feels a tearing and right eye swelling with headaches.  Sometimes she has double vision with the headaches.  Headaches are occurring almost on a daily basis.  Patient was tried on propranolol without relief.  She tried sumatriptan without relief.  Positive family history of migraine in her mother.     REVIEW OF SYSTEMS: Out of a complete 14 system review of symptoms, the patient complains only of the following symptoms, and all other reviewed systems are negative.   ALLERGIES: Allergies  Allergen Reactions  . Topiramate Other (See Comments)    "makes me feel light headed, weird"  . Melatonin Itching and Rash     HOME MEDICATIONS: Outpatient Medications Prior to Visit  Medication Sig Dispense Refill  . Erenumab-aooe (AIMOVIG) 70 MG/ML SOAJ Inject 70 mg into the skin every 30 (thirty) days. 3 pen 4  . rizatriptan (MAXALT-MLT) 10 MG disintegrating tablet Take 1 tablet (10 mg total) by mouth as needed for migraine. May repeat in 2 hours if  needed 9 tablet 11  . SPRINTEC 28 0.25-35 MG-MCG tablet Take 1 tablet by mouth daily.     No facility-administered medications prior to visit.     PAST MEDICAL HISTORY: Past Medical History:  Diagnosis Date  . Headache   . Palpitations      PAST SURGICAL HISTORY: Past Surgical History:  Procedure Laterality Date  . cholecystectomy    . NO PAST SURGERIES       FAMILY HISTORY: Family History  Problem Relation Age of Onset  . Hypertension Father   . Hyperlipidemia Maternal Grandmother   . Heart murmur Maternal Grandfather      SOCIAL HISTORY: Social History   Socioeconomic History  . Marital status: Married    Spouse name: Not on file  . Number of children: 1  . Years of education: 35  . 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 Smoking status: Never Smoker  . Smokeless tobacco: Never Used  Substance and Sexual Activity  . Alcohol use: No  . Drug use: No  . Sexual activity: Not on file  Other Topics Concern  . Not on file  Social History Narrative   Lives home with husband and child.  She is homemaker.  HS Graduate.  Drinks 7 cans soda daily.   Social Determinants of Health   Financial Resource Strain:   . Difficulty of Paying Living Expenses: Not on file  Food Insecurity:   . Worried About 14  in the Last Year: Not on file  . Ran Out of Food in the Last Year: Not on file  Transportation Needs:   . Lack of Transportation (Medical): Not on file  . Lack of Transportation (Non-Medical): Not on file  Physical Activity:   . Days of Exercise per Week: Not on file  . Minutes of Exercise per Session: Not on file  Stress:   . Feeling of Stress : Not on file  Social Connections:   . Frequency of Communication with Friends and Family: Not on file  . Frequency of Social Gatherings with Friends and Family: Not on file  . Attends Religious Services: Not on file  . Active Member of Clubs or  Organizations: Not on file  . Attends Banker Meetings: Not on file  . Marital Status: Not on file  Intimate Partner Violence:   . Fear of Current or Ex-Partner: Not on file  . Emotionally Abused: Not on file  . Physically Abused: Not on file  . Sexually Abused: Not on file      PHYSICAL EXAM  There were no vitals filed for this visit. There is no height or weight on file to calculate BMI.   Generalized: Well developed, in no acute distress   Neurological examination  Mentation: Alert oriented to time, place, history taking. Follows all commands speech and language fluent Cranial nerve II-XII: Pupils were equal round reactive to light. Extraocular movements were full, visual field were full on confrontational test. Facial sensation and strength were normal. Uvula tongue midline. Head turning and shoulder shrug  were normal and symmetric. Motor: The motor testing reveals 5 over 5 strength of all 4 extremities. Good symmetric motor tone is noted throughout.  Sensory: Sensory testing is intact to soft touch on all 4 extremities. No evidence of extinction is noted.  Coordination: Cerebellar testing reveals good finger-nose-finger and heel-to-shin bilaterally.  Gait and station: Gait is normal. Tandem gait is normal. Romberg is negative. No drift is seen.  Reflexes: Deep tendon reflexes are symmetric and normal bilaterally.     DIAGNOSTIC DATA (LABS, IMAGING, TESTING) - I reviewed patient records, labs, notes, testing and imaging myself where available.  Lab Results  Component Value Date   WBC 8.0 10/05/2016   HGB 13.6 10/05/2016   HCT 38.9 10/05/2016   MCV 86.4 10/05/2016   PLT 240 10/05/2016      Component Value Date/Time   NA 137 10/05/2016 1738   K 3.7 10/05/2016 1738   CL 106 10/05/2016 1738   CO2 24 10/05/2016 1738   GLUCOSE 108 (H) 10/05/2016 1738   BUN 8 10/05/2016 1738   CREATININE 0.81 10/05/2016 1738   CALCIUM 9.0 10/05/2016 1738   PROT 6.4 (L)  10/05/2016 1738   ALBUMIN 3.9 10/05/2016 1738   AST 20 10/05/2016 1738   ALT 11 (L) 10/05/2016 1738   ALKPHOS 55 10/05/2016 1738   BILITOT 0.4 10/05/2016 1738   GFRNONAA >60 10/05/2016 1738   GFRAA >60 10/05/2016 1738   No results found for: CHOL, HDL, LDLCALC, LDLDIRECT, TRIG, CHOLHDL No results found for: WPYK9X No results found for: VITAMINB12 Lab Results  Component Value Date   TSH 1.248 10/05/2016      ASSESSMENT AND PLAN  24 y.o. year old female  has a past medical history of Headache and Palpitations. here with ***  No diagnosis found.   I spent 20 minutes of face-to-face and non-face-to-face time with patient.  This included previsit chart review, lab review,  study review, order entry, electronic health record documentation, patient education.    Shawnie Dapper, MSN, FNP-C 03/27/2020, 9:50 AM  [facility] [address], [address] [place], [place] [address] [phone]

## 2020-08-23 ENCOUNTER — Encounter: Payer: Self-pay | Admitting: Internal Medicine

## 2020-09-14 ENCOUNTER — Other Ambulatory Visit: Payer: Self-pay

## 2020-09-14 ENCOUNTER — Encounter: Payer: Self-pay | Admitting: *Deleted

## 2020-09-14 ENCOUNTER — Encounter: Payer: Self-pay | Admitting: Nurse Practitioner

## 2020-09-14 ENCOUNTER — Telehealth: Payer: Self-pay | Admitting: *Deleted

## 2020-09-14 ENCOUNTER — Ambulatory Visit: Payer: Self-pay | Admitting: Nurse Practitioner

## 2020-09-14 ENCOUNTER — Encounter: Payer: Self-pay | Admitting: Internal Medicine

## 2020-09-14 DIAGNOSIS — R634 Abnormal weight loss: Secondary | ICD-10-CM

## 2020-09-14 DIAGNOSIS — R109 Unspecified abdominal pain: Secondary | ICD-10-CM | POA: Insufficient documentation

## 2020-09-14 DIAGNOSIS — R112 Nausea with vomiting, unspecified: Secondary | ICD-10-CM | POA: Insufficient documentation

## 2020-09-14 DIAGNOSIS — R197 Diarrhea, unspecified: Secondary | ICD-10-CM

## 2020-09-14 DIAGNOSIS — R1013 Epigastric pain: Secondary | ICD-10-CM

## 2020-09-14 MED ORDER — OMEPRAZOLE 40 MG PO CPDR
40.0000 mg | DELAYED_RELEASE_CAPSULE | Freq: Every day | ORAL | 3 refills | Status: DC
Start: 2020-09-14 — End: 2022-05-16

## 2020-09-14 MED ORDER — CHOLESTYRAMINE 4 G PO PACK: 4.0000 g | PACK | Freq: Three times a day (TID) | ORAL | 2 refills | Status: DC

## 2020-09-14 MED ORDER — ONDANSETRON 4 MG PO TBDP: 4.0000 mg | ORAL_TABLET | Freq: Three times a day (TID) | ORAL | 2 refills | Status: DC | PRN

## 2020-09-14 NOTE — H&P (View-Only) (Signed)
Primary Care Physician:  Caryl Bis, MD Primary Gastroenterologist:  Dr. Abbey Chatters  Chief Complaint  Patient presents with  . Diarrhea    Bm is never normal  . Nausea    Has been checked for H. Pylori, Alpha Gal, Celiac-all normal  . vomiting    everyday  . Weight Loss    Lost approx 8-10 lbs in past month    HPI:   Marilyn Chapman is a 25 y.o. female who presents on referral from primary care for abdominal pain, nausea, diarrhea.  Reviewed information provided with referral including office visit dated 08/21/2021.  At that time she noted anxiety problems and difficulty sleeping.  Has been on trazodone which is helped somewhat.  Noted epigastric pain for several months with intermittent diarrhea constipation.  Takes reflux medication but not improving.  Still with nausea, especially after eating.  Status post cholecystectomy.  Feels like symptoms worsened after her cholecystectomy.  Of note, her father did pass away in September from a myocardial infarction.  She was living with him and is having a hard time coping living with her mom with whom she has a difficult relationship with.  She is very tearful, insomnia, difficulty coping.  She noted anxiety, depression, and loss of interest but no suicidal or homicidal ideation..  No history of colonoscopy or endoscopy found in our system.  Today she states is doing okay overall. She's always had stomach problems, which has gotten worse. PPIs have helped somewhat but not resolved the issue. Has daily vomiting, food makes her sick. Stomach "feels weak" and even smelling food makes her nauseated and will vomit or have diarrhea. Has lost a lot of weight (8-10 lb subjectively) in the last month. Appetite is poor (she does get hungry, but doesn't wanna have vomiting). She is not on any medication for N/V. Has a bowel movement daily, typically watery stool, about 2-3 stools a day; most stools are postprandial. She is frustrated with feeling sick.  Denies hematochezia, melena, fever, chills. Denies NSAIDs, ASA powders. Denies URI or flu-like symptoms. Denies loss of sense of taste or smell. The patient has received COVID-19 vaccination(s). Denies chest pain, dyspnea, dizziness, lightheadedness, syncope, near syncope. Denies any other upper or lower GI symptoms.  Cholecystectomy was about 3 years ago. Mom has Barrett's Esophagus.  Past Medical History:  Diagnosis Date  . Headache   . Palpitations     Past Surgical History:  Procedure Laterality Date  . cholecystectomy    . NO PAST SURGERIES      Current Outpatient Medications  Medication Sig Dispense Refill  . rizatriptan (MAXALT-MLT) 10 MG disintegrating tablet Take 1 tablet (10 mg total) by mouth as needed for migraine. May repeat in 2 hours if needed 9 tablet 11  . SPRINTEC 28 0.25-35 MG-MCG tablet Take 1 tablet by mouth daily.    . traZODone (DESYREL) 50 MG tablet Take 1.5 tablets by mouth at bedtime.    Eduard Roux (AIMOVIG) 70 MG/ML SOAJ Inject 70 mg into the skin every 30 (thirty) days. (Patient not taking: Reported on 09/14/2020) 3 pen 4   No current facility-administered medications for this visit.    Allergies as of 09/14/2020 - Review Complete 09/14/2020  Allergen Reaction Noted  . Topiramate Other (See Comments) 07/27/2019  . Melatonin Itching and Rash 02/19/2016    Family History  Problem Relation Age of Onset  . Hypertension Father   . Hyperlipidemia Maternal Grandmother   . Heart murmur Maternal Grandfather  Social History   Socioeconomic History  . Marital status: Single    Spouse name: Not on file  . Number of children: 1  . Years of education: 86  . 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 Smoking status: Never Smoker  . Smokeless tobacco: Never Used  Substance and Sexual Activity  . Alcohol use: No  . Drug use: No  . Sexual activity: Not on file  Other Topics Concern  . Not  on file  Social History Narrative   Lives home with husband and child.  She is homemaker.  HS Graduate.  Drinks 7 cans soda daily.   Social Determinants of Health   Financial Resource Strain: Not on file  Food Insecurity: Not on file  Transportation Needs: Not on file  Physical Activity: Not on file  Stress: Not on file  Social Connections: Not on file  Intimate Partner Violence: Not on file    Subjective: Review of Systems  Constitutional: Positive for weight loss. Negative for chills, fever and malaise/fatigue.       Poor appetite  HENT: Negative for congestion and sore throat.   Respiratory: Negative for cough and shortness of breath.   Cardiovascular: Negative for chest pain and palpitations.  Gastrointestinal: Positive for abdominal pain, diarrhea, nausea and vomiting. Negative for blood in stool, constipation and melena.  Musculoskeletal: Negative for joint pain and myalgias.  Skin: Negative for rash.  Neurological: Negative for dizziness and weakness.  Endo/Heme/Allergies: Does not bruise/bleed easily.  Psychiatric/Behavioral: Positive for depression. The patient is nervous/anxious.        Grieving  All other systems reviewed and are negative.      Objective: BP 127/78   Pulse 96   Temp (!) 97.5 F (36.4 C) (Temporal)   Ht _0  (1.575 m)   Wt 122 lb 6.4 oz (55.5 kg)   LMP 09/04/2020 (Approximate)   BMI 22.39 kg/m  Physical Exam Vitals and nursing note reviewed.  Constitutional:      General: She is not in acute distress.    Appearance: Normal appearance. She is well-developed. She is not ill-appearing, toxic-appearing or diaphoretic.  HENT:     Head: Normocephalic and atraumatic.     Nose: No congestion or rhinorrhea.  Eyes:     General: No scleral icterus. Cardiovascular:     Rate and Rhythm: Normal rate and regular rhythm.     Heart sounds: Normal heart sounds.  Pulmonary:     Effort: Pulmonary effort is normal. No respiratory distress.     Breath  sounds: Normal breath sounds.  Abdominal:     General: Bowel sounds are normal.     Palpations: Abdomen is soft. There is no hepatomegaly, splenomegaly or mass.     Tenderness: There is no abdominal tenderness. There is no guarding or rebound.     Hernia: No hernia is present.  Skin:    General: Skin is warm and dry.     Coloration: Skin is not jaundiced.     Findings: No rash.  Neurological:     General: No focal deficit present.     Mental Status: She is alert and oriented to person, place, and time.  Psychiatric:        Attention and Perception: Attention normal.        Mood and Affect: Mood normal.        Speech: Speech normal.  Behavior: Behavior normal.        Thought Content: Thought content normal.        Cognition and Memory: Cognition and memory normal.      Assessment:  Very pleasant 25 year old female presents for evaluation of a multitude of symptoms including nausea, vomiting, weight loss, abdominal pain, diarrhea.  She states her symptoms, especially the diarrhea, started around the time she had her gallbladder out.  Her symptoms are gotten progressively worse.  She is having 2-3 watery stools a day, typically postprandial.  She has daily nausea and vomiting, she is now scared to eat due to diarrhea and vomiting.  Epigastric abdominal pain, at times severe.  Currently on Protonix daily but not really helping significantly.  She is not on any nausea medication.  Overall I feel her diarrhea could be possibly irritable bowel syndrome, stress/anxiety with the recent passing of her father, bile salt diarrhea, less likely occult infection, IBD.  At this point I will have her try Questran 4 g 3 times daily before meals.  I discussed the need to space Questran from her other medications as is typical education/counseling.  She is to call us for any worsening symptoms.  For her GERD with associated nausea and vomiting I will have her stop her tonics and start omeprazole 40 mg  daily to see if we get any improvement with another PPI.  I will give her Zofran ODT to use every 8 hours as needed for nausea.  I will also plan for an EGD to further evaluate given her persistent symptoms despite appropriate PPI therapy.  Further recommendations will follow.  MS (had some weight loss in the past couple months given that she is "afraid to eat" because of postprandial stools and nausea/vomiting.  I feel we can better manage her symptoms she will eat better.  She does have a good appetite, but does not want to eat because it fear.  If needed despite appropriate work-up and treatment, we can consider colonoscopy for further evaluation as well.   Proceed with EGD on propofol/MAC with Dr. Abbey Chatters on propofol/MAC in near future: the risks, benefits, and alternatives have been discussed with the patient in detail. The patient states understanding and desires to proceed.  ASA II   Plan: 1. Stop Protonix 2. Start omeprazole 40 mg daily 3. Zofran ODT every 8 hours as needed 4. Questran 4 g 3 times daily with meals 5. EGD as described above 6. Follow-up in 2 months 7. Call us for worsening or severe symptoms    Thank you for allowing [place] to participate in the care of Ardelle Park, DNP, AGNP-C Adult & Gerontological Nurse Practitioner [facility]   09/14/2020 2:20 PM   Disclaimer: This note was dictated with voice recognition software. Similar sounding words can inadvertently be transcribed and may not be corrected upon review.

## 2020-09-14 NOTE — Telephone Encounter (Signed)
Per [employer]: The following solutions for the service date entered do not require Pre-Authorization by [employer]. Please contact the health plan using the number on the back of the member's ID card to determine if a Pre-Authorization is required.  

## 2020-09-14 NOTE — Progress Notes (Signed)
  Primary Care Physician:  Daniel, Terry G, MD Primary Gastroenterologist:  Dr. Carver  Chief Complaint  Patient presents with  . Diarrhea    Bm is never normal  . Nausea    Has been checked for H. Pylori, Alpha Gal, Celiac-all normal  . vomiting    everyday  . Weight Loss    Lost approx 8-10 lbs in past month    HPI:   Marilyn Chapman is a 25 y.o. female who presents on referral from primary care for abdominal pain, nausea, diarrhea.  Reviewed information provided with referral including office visit dated 08/21/2021.  At that time she noted anxiety problems and difficulty sleeping.  Has been on trazodone which is helped somewhat.  Noted epigastric pain for several months with intermittent diarrhea constipation.  Takes reflux medication but not improving.  Still with nausea, especially after eating.  Status post cholecystectomy.  Feels like symptoms worsened after her cholecystectomy.  Of note, her father did pass away in September from a myocardial infarction.  She was living with him and is having a hard time coping living with her mom with whom she has a difficult relationship with.  She is very tearful, insomnia, difficulty coping.  She noted anxiety, depression, and loss of interest but no suicidal or homicidal ideation..  No history of colonoscopy or endoscopy found in our system.  Today she states is doing okay overall. She's always had stomach problems, which has gotten worse. PPIs have helped somewhat but not resolved the issue. Has daily vomiting, food makes her sick. Stomach "feels weak" and even smelling food makes her nauseated and will vomit or have diarrhea. Has lost a lot of weight (8-10 lb subjectively) in the last month. Appetite is poor (she does get hungry, but doesn't wanna have vomiting). She is not on any medication for N/V. Has a bowel movement daily, typically watery stool, about 2-3 stools a day; most stools are postprandial. She is frustrated with feeling sick.  Denies hematochezia, melena, fever, chills. Denies NSAIDs, ASA powders. Denies URI or flu-like symptoms. Denies loss of sense of taste or smell. The patient has received COVID-19 vaccination(s). Denies chest pain, dyspnea, dizziness, lightheadedness, syncope, near syncope. Denies any other upper or lower GI symptoms.  Cholecystectomy was about 3 years ago. Mom has Barrett's Esophagus.  Past Medical History:  Diagnosis Date  . Headache   . Palpitations     Past Surgical History:  Procedure Laterality Date  . cholecystectomy    . NO PAST SURGERIES      Current Outpatient Medications  Medication Sig Dispense Refill  . rizatriptan (MAXALT-MLT) 10 MG disintegrating tablet Take 1 tablet (10 mg total) by mouth as needed for migraine. May repeat in 2 hours if needed 9 tablet 11  . SPRINTEC 28 0.25-35 MG-MCG tablet Take 1 tablet by mouth daily.    . traZODone (DESYREL) 50 MG tablet Take 1.5 tablets by mouth at bedtime.    . Erenumab-aooe (AIMOVIG) 70 MG/ML SOAJ Inject 70 mg into the skin every 30 (thirty) days. (Patient not taking: Reported on 09/14/2020) 3 pen 4   No current facility-administered medications for this visit.    Allergies as of 09/14/2020 - Review Complete 09/14/2020  Allergen Reaction Noted  . Topiramate Other (See Comments) 07/27/2019  . Melatonin Itching and Rash 02/19/2016    Family History  Problem Relation Age of Onset  . Hypertension Father   . Hyperlipidemia Maternal Grandmother   . Heart murmur Maternal Grandfather       Social History   Socioeconomic History  . Marital status: Single    Spouse name: Not on file  . Number of children: 1  . Years of education: 12  . 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 Smoking status: Never Smoker  . Smokeless tobacco: Never Used  Substance and Sexual Activity  . Alcohol use: No  . Drug use: No  . Sexual activity: Not on file  Other Topics Concern  . Not  on file  Social History Narrative   Lives home with husband and child.  She is homemaker.  HS Graduate.  Drinks 7 cans soda daily.   Social Determinants of Health   Financial Resource Strain: Not on file  Food Insecurity: Not on file  Transportation Needs: Not on file  Physical Activity: Not on file  Stress: Not on file  Social Connections: Not on file  Intimate Partner Violence: Not on file    Subjective: Review of Systems  Constitutional: Positive for weight loss. Negative for chills, fever and malaise/fatigue.       Poor appetite  HENT: Negative for congestion and sore throat.   Respiratory: Negative for cough and shortness of breath.   Cardiovascular: Negative for chest pain and palpitations.  Gastrointestinal: Positive for abdominal pain, diarrhea, nausea and vomiting. Negative for blood in stool, constipation and melena.  Musculoskeletal: Negative for joint pain and myalgias.  Skin: Negative for rash.  Neurological: Negative for dizziness and weakness.  Endo/Heme/Allergies: Does not bruise/bleed easily.  Psychiatric/Behavioral: Positive for depression. The patient is nervous/anxious.        Grieving  All other systems reviewed and are negative.      Objective: BP 127/78   Pulse 96   Temp (!) 97.5 F (36.4 C) (Temporal)   Ht 5' 2" (1.575 m)   Wt 122 lb 6.4 oz (55.5 kg)   LMP 09/04/2020 (Approximate)   BMI 22.39 kg/m  Physical Exam Vitals and nursing note reviewed.  Constitutional:      General: She is not in acute distress.    Appearance: Normal appearance. She is well-developed. She is not ill-appearing, toxic-appearing or diaphoretic.  HENT:     Head: Normocephalic and atraumatic.     Nose: No congestion or rhinorrhea.  Eyes:     General: No scleral icterus. Cardiovascular:     Rate and Rhythm: Normal rate and regular rhythm.     Heart sounds: Normal heart sounds.  Pulmonary:     Effort: Pulmonary effort is normal. No respiratory distress.     Breath  sounds: Normal breath sounds.  Abdominal:     General: Bowel sounds are normal.     Palpations: Abdomen is soft. There is no hepatomegaly, splenomegaly or mass.     Tenderness: There is no abdominal tenderness. There is no guarding or rebound.     Hernia: No hernia is present.  Skin:    General: Skin is warm and dry.     Coloration: Skin is not jaundiced.     Findings: No rash.  Neurological:     General: No focal deficit present.     Mental Status: She is alert and oriented to person, place, and time.  Psychiatric:        Attention and Perception: Attention normal.        Mood and Affect: Mood normal.        Speech: Speech normal.          Behavior: Behavior normal.        Thought Content: Thought content normal.        Cognition and Memory: Cognition and memory normal.      Assessment:  Very pleasant 24-year-old female presents for evaluation of a multitude of symptoms including nausea, vomiting, weight loss, abdominal pain, diarrhea.  She states her symptoms, especially the diarrhea, started around the time she had her gallbladder out.  Her symptoms are gotten progressively worse.  She is having 2-3 watery stools a day, typically postprandial.  She has daily nausea and vomiting, she is now scared to eat due to diarrhea and vomiting.  Epigastric abdominal pain, at times severe.  Currently on Protonix daily but not really helping significantly.  She is not on any nausea medication.  Overall I feel her diarrhea could be possibly irritable bowel syndrome, stress/anxiety with the recent passing of her father, bile salt diarrhea, less likely occult infection, IBD.  At this point I will have her try Questran 4 g 3 times daily before meals.  I discussed the need to space Questran from her other medications as is typical education/counseling.  She is to call us for any worsening symptoms.  For her GERD with associated nausea and vomiting I will have her stop her tonics and start omeprazole 40 mg  daily to see if we get any improvement with another PPI.  I will give her Zofran ODT to use every 8 hours as needed for nausea.  I will also plan for an EGD to further evaluate given her persistent symptoms despite appropriate PPI therapy.  Further recommendations will follow.  MS (had some weight loss in the past couple months given that she is "afraid to eat" because of postprandial stools and nausea/vomiting.  I feel we can better manage her symptoms she will eat better.  She does have a good appetite, but does not want to eat because it fear.  If needed despite appropriate work-up and treatment, we can consider colonoscopy for further evaluation as well.   Proceed with EGD on propofol/MAC with Dr. Carver on propofol/MAC in near future: the risks, benefits, and alternatives have been discussed with the patient in detail. The patient states understanding and desires to proceed.  ASA II   Plan: 1. Stop Protonix 2. Start omeprazole 40 mg daily 3. Zofran ODT every 8 hours as needed 4. Questran 4 g 3 times daily with meals 5. EGD as described above 6. Follow-up in 2 months 7. Call us for worsening or severe symptoms    Thank you for allowing us to participate in the care of Marilyn Chapman  Marilyn Seibold, DNP, AGNP-C Adult & Gerontological Nurse Practitioner [facility]   09/14/2020 2:20 PM   Disclaimer: This note was dictated with voice recognition software. Similar sounding words can inadvertently be transcribed and may not be corrected upon review.  

## 2020-09-14 NOTE — Patient Instructions (Addendum)
Your health issues we discussed today were:   Diarrhea: 1. As discussed, I feel your diarrhea could be related to having her gallbladder out 2. I have prescribed Questran 4 g.  Take this 3 times a day with meals 3. As we discussed, use Questran at least 1 hour after or 4 hours before other medications. 4. Call us for any worsening or severe symptoms  Abdominal pain with nausea and vomiting: 1. Stop taking Protonix 2. Have sent prescription for omeprazole (Prilosec) 40 mg. 3. Take this once a day, pursing in the morning 4. Call us if you do not get any better 5. Have also sent Zofran dissolvable tablet to your pharmacy.  You can take this every 8 hours as needed for nausea 6. We will schedule an upper endoscopy as we discussed 7. Further recommendations will follow your endoscopy  Weight loss: 1. I feel your weight loss is because of your fear of eating related to the diarrhea and nausea/vomiting 2. If we get your symptoms better managed I feel will better enjoy food again and naturally put weight back on 3. Depending on your findings and how you respond to your medications, we may need to do further testing for this as well 4. Further recommendations will follow  Overall I recommend:  1. Continue other current medications 2. Return for follow-up in 2 months 3. Call us for any questions or concerns   ---------------------------------------------------------------  I am glad you have gotten your COVID-19 vaccination!  Even though you are fully vaccinated you should continue to follow CDC and state/local guidelines.  ---------------------------------------------------------------   At [facility] we value your feedback. You may receive a survey about your visit today. Please share your experience as we strive to create trusting relationships with our patients to provide genuine, compassionate, quality care.  We appreciate your understanding and patience as we review  any laboratory studies, imaging, and other diagnostic tests that are ordered as we care for you. Our office policy is 5 business days for review of these results, and any emergent or urgent results are addressed in a timely manner for your best interest. If you do not hear from our office in 1 week, please contact us.   We also encourage the use of MyChart, which contains your medical information for your review as well. If you are not enrolled in this feature, an access code is on this after visit summary for your convenience. Thank you for allowing [place] to be involved in your care.  It was great to see you today!  I hope you have a great spring!!

## 2020-09-15 ENCOUNTER — Other Ambulatory Visit: Payer: Self-pay

## 2020-09-15 ENCOUNTER — Other Ambulatory Visit (HOSPITAL_COMMUNITY)
Admission: RE | Admit: 2020-09-15 | Discharge: 2020-09-15 | Disposition: A | Discharge status: Home / Self Care | Payer: BC Managed Care – PPO | Source: Ambulatory Visit | Attending: Internal Medicine | Admitting: Internal Medicine

## 2020-09-15 DIAGNOSIS — R1013 Epigastric pain: Secondary | ICD-10-CM | POA: Diagnosis present

## 2020-09-15 DIAGNOSIS — Z79899 Other long term (current) drug therapy: Secondary | ICD-10-CM | POA: Diagnosis not present

## 2020-09-15 DIAGNOSIS — R634 Abnormal weight loss: Secondary | ICD-10-CM | POA: Diagnosis not present

## 2020-09-15 DIAGNOSIS — Z888 Allergy status to other drugs, medicaments and biological substances status: Secondary | ICD-10-CM | POA: Diagnosis not present

## 2020-09-15 DIAGNOSIS — Z8349 Family history of other endocrine, nutritional and metabolic diseases: Secondary | ICD-10-CM | POA: Diagnosis not present

## 2020-09-15 DIAGNOSIS — Z20822 Contact with and (suspected) exposure to covid-19: Secondary | ICD-10-CM | POA: Diagnosis not present

## 2020-09-15 DIAGNOSIS — Z8249 Family history of ischemic heart disease and other diseases of the circulatory system: Secondary | ICD-10-CM | POA: Diagnosis not present

## 2020-09-15 DIAGNOSIS — K219 Gastro-esophageal reflux disease without esophagitis: Secondary | ICD-10-CM | POA: Diagnosis not present

## 2020-09-15 DIAGNOSIS — R112 Nausea with vomiting, unspecified: Secondary | ICD-10-CM | POA: Diagnosis not present

## 2020-09-15 DIAGNOSIS — K449 Diaphragmatic hernia without obstruction or gangrene: Secondary | ICD-10-CM | POA: Diagnosis not present

## 2020-09-15 DIAGNOSIS — K5900 Constipation, unspecified: Secondary | ICD-10-CM | POA: Diagnosis not present

## 2020-09-15 DIAGNOSIS — Z6822 Body mass index (BMI) 22.0-22.9, adult: Secondary | ICD-10-CM | POA: Diagnosis not present

## 2020-09-15 DIAGNOSIS — R197 Diarrhea, unspecified: Secondary | ICD-10-CM | POA: Diagnosis not present

## 2020-09-15 DIAGNOSIS — K2970 Gastritis, unspecified, without bleeding: Secondary | ICD-10-CM | POA: Diagnosis not present

## 2020-09-15 LAB — PREGNANCY, URINE: Preg Test, Ur: NEGATIVE

## 2020-09-15 LAB — HCG, SERUM, QUALITATIVE: Preg, Serum: NEGATIVE

## 2020-09-16 LAB — SARS CORONAVIRUS 2 (TAT 6-24 HRS): SARS Coronavirus 2: NEGATIVE

## 2020-09-18 ENCOUNTER — Other Ambulatory Visit: Payer: Self-pay

## 2020-09-18 ENCOUNTER — Encounter (HOSPITAL_COMMUNITY): Payer: Self-pay

## 2020-09-18 ENCOUNTER — Ambulatory Visit (HOSPITAL_COMMUNITY): Payer: BC Managed Care – PPO | Admitting: Anesthesiology

## 2020-09-18 ENCOUNTER — Ambulatory Visit (HOSPITAL_COMMUNITY)
Admission: RE | Admit: 2020-09-18 | Discharge: 2020-09-18 | Disposition: A | Discharge status: Home / Self Care | Payer: BC Managed Care – PPO | Attending: Internal Medicine | Admitting: Internal Medicine

## 2020-09-18 ENCOUNTER — Encounter (HOSPITAL_COMMUNITY)
Admission: RE | Disposition: A | Discharge status: Home / Self Care | Payer: Self-pay | Source: Home / Self Care | Attending: Internal Medicine

## 2020-09-18 DIAGNOSIS — R634 Abnormal weight loss: Secondary | ICD-10-CM | POA: Insufficient documentation

## 2020-09-18 DIAGNOSIS — R112 Nausea with vomiting, unspecified: Secondary | ICD-10-CM | POA: Diagnosis not present

## 2020-09-18 DIAGNOSIS — Z6822 Body mass index (BMI) 22.0-22.9, adult: Secondary | ICD-10-CM | POA: Insufficient documentation

## 2020-09-18 DIAGNOSIS — Z8349 Family history of other endocrine, nutritional and metabolic diseases: Secondary | ICD-10-CM | POA: Insufficient documentation

## 2020-09-18 DIAGNOSIS — Z8249 Family history of ischemic heart disease and other diseases of the circulatory system: Secondary | ICD-10-CM | POA: Insufficient documentation

## 2020-09-18 DIAGNOSIS — R1013 Epigastric pain: Secondary | ICD-10-CM | POA: Diagnosis not present

## 2020-09-18 DIAGNOSIS — Z20822 Contact with and (suspected) exposure to covid-19: Secondary | ICD-10-CM | POA: Insufficient documentation

## 2020-09-18 DIAGNOSIS — Z888 Allergy status to other drugs, medicaments and biological substances status: Secondary | ICD-10-CM | POA: Insufficient documentation

## 2020-09-18 DIAGNOSIS — K2970 Gastritis, unspecified, without bleeding: Secondary | ICD-10-CM | POA: Diagnosis not present

## 2020-09-18 DIAGNOSIS — K449 Diaphragmatic hernia without obstruction or gangrene: Secondary | ICD-10-CM | POA: Insufficient documentation

## 2020-09-18 DIAGNOSIS — K219 Gastro-esophageal reflux disease without esophagitis: Secondary | ICD-10-CM | POA: Insufficient documentation

## 2020-09-18 DIAGNOSIS — Z79899 Other long term (current) drug therapy: Secondary | ICD-10-CM | POA: Insufficient documentation

## 2020-09-18 DIAGNOSIS — K5900 Constipation, unspecified: Secondary | ICD-10-CM | POA: Insufficient documentation

## 2020-09-18 DIAGNOSIS — R197 Diarrhea, unspecified: Secondary | ICD-10-CM | POA: Insufficient documentation

## 2020-09-18 HISTORY — PX: BIOPSY: SHX5522

## 2020-09-18 HISTORY — PX: ESOPHAGOGASTRODUODENOSCOPY (EGD) WITH PROPOFOL: SHX5813

## 2020-09-18 SURGERY — ESOPHAGOGASTRODUODENOSCOPY (EGD) WITH PROPOFOL
Anesthesia: General

## 2020-09-18 MED ORDER — ONDANSETRON HCL 4 MG/2ML IJ SOLN
INTRAMUSCULAR | Status: AC
  Filled 2020-09-18: qty 2

## 2020-09-18 MED ORDER — ONDANSETRON HCL 4 MG/2ML IJ SOLN
INTRAMUSCULAR | Status: DC | PRN
  Administered 2020-09-18: 4 mg via INTRAVENOUS

## 2020-09-18 MED ORDER — LIDOCAINE HCL (CARDIAC) PF 100 MG/5ML IV SOSY
PREFILLED_SYRINGE | INTRAVENOUS | Status: DC | PRN
  Administered 2020-09-18: 50 mg via INTRAVENOUS

## 2020-09-18 MED ORDER — PROPOFOL 10 MG/ML IV BOLUS
INTRAVENOUS | Status: DC | PRN
  Administered 2020-09-18 (×2): 50 mg via INTRAVENOUS
  Administered 2020-09-18: 100 mg via INTRAVENOUS
  Administered 2020-09-18: 5 mg via INTRAVENOUS

## 2020-09-18 MED ORDER — LACTATED RINGERS IV SOLN: INTRAVENOUS | Status: DC

## 2020-09-18 NOTE — Interval H&P Note (Signed)
History and Physical Interval Note:  09/18/2020 12:35 PM  Marilyn Chapman  has presented today for surgery, with the diagnosis of nausea, vomiting, weight loss.  The various methods of treatment have been discussed with the patient and family. After consideration of risks, benefits and other options for treatment, the patient has consented to  Procedure(s) with comments: ESOPHAGOGASTRODUODENOSCOPY (EGD) WITH PROPOFOL (N/A) - am appt as a surgical intervention.  The patient's history has been reviewed, patient examined, no change in status, stable for surgery.  I have reviewed the patient's chart and labs.  Questions were answered to the patient's satisfaction.     Lanelle Bal

## 2020-09-18 NOTE — Anesthesia Postprocedure Evaluation (Signed)
Anesthesia Post Note  Patient: Marilyn Chapman  Procedure(s) Performed: ESOPHAGOGASTRODUODENOSCOPY (EGD) WITH PROPOFOL (N/A ) BIOPSY  Patient location during evaluation: Endoscopy Anesthesia Type: General Level of consciousness: awake and alert Pain management: pain level controlled Vital Signs Assessment: post-procedure vital signs reviewed and stable Respiratory status: spontaneous breathing, nonlabored ventilation, respiratory function stable and patient connected to nasal cannula oxygen Cardiovascular status: blood pressure returned to baseline and stable Postop Assessment: no apparent nausea or vomiting Anesthetic complications: no   No complications documented.   Last Vitals:  Vitals:   09/18/20 1217 09/18/20 1315  BP: 119/77 (!) 106/58  Resp: 14 (!) 21  Temp: 37.1 C 36.8 C  SpO2: 99% 97%    Last Pain:  Vitals:   09/18/20 1315  TempSrc: Oral  PainSc: 0-No pain                 Blythe Stanford

## 2020-09-18 NOTE — Op Note (Signed)
[facility] Patient Name: Marilyn Chapman Procedure Date: 09/18/2020 12:48 PM MRN: [id number] Date of Birth: 18-Dec-1995 Attending MD: Elon Alas. Abbey Chatters DO CSN: [id number] Age: 25 Admit Type: Outpatient Procedure:                Upper GI endoscopy Indications:              Epigastric abdominal pain, Nausea with vomiting,                            Weight loss Providers:                Elon Alas. Abbey Chatters, DO, Gwenlyn Fudge, RN, Dereck Leep, Technician Referring MD:              Medicines:                See the Anesthesia note for documentation of the                            administered medications Complications:            No immediate complications. Estimated Blood Loss:     Estimated blood loss was minimal. Procedure:                Pre-Anesthesia Assessment:                           - The anesthesia plan was to use monitored                            anesthesia care (MAC).                           After obtaining informed consent, the endoscope was                            passed under direct vision. Throughout the                            procedure, the patient's blood pressure, pulse, and                            oxygen saturations were monitored continuously. The                            [id number]-H190 ([id number]) scope was introduced through the                            mouth, and advanced to the second part of duodenum.                            The upper GI endoscopy was accomplished without                            difficulty. The patient tolerated  the procedure                            well. Scope In: 1:06:18 PM Scope Out: 1:09:25 PM Total Procedure Duration: 0 hours 3 minutes 7 seconds  Findings:      A medium-sized hiatal hernia was present.      Biopsies were taken with a cold forceps in the middle third of the       esophagus for histology.      Diffuse mild inflammation characterized by erythema was found in the        entire examined stomach. Biopsies were taken with a cold forceps for       Helicobacter pylori testing.      The duodenal bulb, first portion of the duodenum and second portion of       the duodenum were normal. Biopsies for histology were taken with a cold       forceps for evaluation of celiac disease. Impression:               - Medium-sized hiatal hernia.                           - Gastritis. Biopsied.                           - Normal duodenal bulb, first portion of the                            duodenum and second portion of the duodenum.                            Biopsied.                           - Biopsies were taken with a cold forceps for                            histology in the middle third of the esophagus. Moderate Sedation:      Per Anesthesia Care Recommendation:           - Patient has a contact number available for                            emergencies. The signs and symptoms of potential                            delayed complications were discussed with the                            patient. Return to normal activities tomorrow.                            Written discharge instructions were provided to the                            patient.                           -  Resume previous diet.                           - Continue present medications.                           - Await pathology results.                           - Use Prilosec (omeprazole) 40 mg PO daily. Procedure Code(s):        --- Professional ---                           [phone], Esophagogastroduodenoscopy, flexible,                            transoral; with biopsy, single or multiple Diagnosis Code(s):        --- Professional ---                           K44.9, Diaphragmatic hernia without obstruction or                            gangrene                           K29.70, Gastritis, unspecified, without bleeding                           R10.13, Epigastric pain                            R11.2, Nausea with vomiting, unspecified                           R63.4, Abnormal weight loss CPT copyright 2019 American Medical Association. All rights reserved. The codes documented in this report are preliminary and upon coder review may  be revised to meet current compliance requirements. Elon Alas. Abbey Chatters, DO [place] Abbey Chatters, DO 09/18/2020 1:13:38 PM This report has been signed electronically. Number of Addenda: 0

## 2020-09-18 NOTE — Discharge Instructions (Addendum)
EGD Discharge instructions Please read the instructions outlined below and refer to this sheet in the next few weeks. These discharge instructions provide you with general information on caring for yourself after you leave the hospital. Your doctor may also give you specific instructions. While your treatment has been planned according to the most current medical practices available, unavoidable complications occasionally occur. If you have any problems or questions after discharge, please call your doctor. ACTIVITY  You may resume your regular activity but move at a slower pace for the next 24 hours.   Take frequent rest periods for the next 24 hours.   Walking will help expel (get rid of) the air and reduce the bloated feeling in your abdomen.   No driving for 24 hours (because of the anesthesia (medicine) used during the test).   You may shower.   Do not sign any important legal documents or operate any machinery for 24 hours (because of the anesthesia used during the test).  NUTRITION  Drink plenty of fluids.   You may resume your normal diet.   Begin with a light meal and progress to your normal diet.   Avoid alcoholic beverages for 24 hours or as instructed by your caregiver.  MEDICATIONS  You may resume your normal medications unless your caregiver tells you otherwise.  WHAT YOU CAN EXPECT TODAY  You may experience abdominal discomfort such as a feeling of fullness or "gas" pains.  FOLLOW-UP  Your doctor will discuss the results of your test with you.  SEEK IMMEDIATE MEDICAL ATTENTION IF ANY OF THE FOLLOWING OCCUR:  Excessive nausea (feeling sick to your stomach) and/or vomiting.   Severe abdominal pain and distention (swelling).   Trouble swallowing.   Temperature over 101 F (37.8 C).   Rectal bleeding or vomiting of blood.    Your EGD revealed a mild amount of inflammation in your stomach.  I took biopsies to rule out infection with a bacteria called H.  pylori.  Also took biopsies of your small bowel as well as your esophagus to rule out underlying conditions.  I want you to start taking omeprazole 40 mg daily.  We will see you back in our clinic in 2 to 3 months to see how you are doing.  My office will call you with biopsy results later this week.  I hope you have a great rest of your week!  Hennie Duos. Marletta Lor, D.O. Gastroenterology and Hepatology [facility]   Gastritis, Adult  Gastritis is swelling (inflammation) of the stomach. Gastritis can develop quickly (acute). It can also develop slowly over time (chronic). It is important to get help for this condition. If you do not get help, your stomach can bleed, and you can get sores (ulcers) in your stomach. What are the causes? This condition may be caused by:  Germs that get to your stomach.  Drinking too much alcohol.  Medicines you are taking.  Too much acid in the stomach.  A disease of the intestines or stomach.  Stress.  An allergic reaction.  Crohn's disease.  Some cancer treatments (radiation). Sometimes the cause of this condition is not known. What are the signs or symptoms? Symptoms of this condition include:  Pain in your stomach.  A burning feeling in your stomach.  Feeling sick to your stomach (nauseous).  Throwing up (vomiting).  Feeling too full after you eat.  Weight loss.  Bad breath.  Throwing up blood.  Blood in your poop (stool). How is this  diagnosed? This condition may be diagnosed with:  Your medical history and symptoms.  A physical exam.  Tests. These can include: ? Blood tests. ? Stool tests. ? A procedure to look inside your stomach (upper endoscopy). ? A test in which a sample of tissue is taken for testing (biopsy). How is this treated? Treatment for this condition depends on what caused it. You may be given:  Antibiotic medicine, if your condition was caused by germs.  H2 blockers and  similar medicines, if your condition was caused by too much acid. Follow these instructions at home: Medicines  Take over-the-counter and prescription medicines only as told by your doctor.  If you were prescribed an antibiotic medicine, take it as told by your doctor. Do not stop taking it even if you start to feel better. Eating and drinking  Eat small meals often, instead of large meals.  Avoid foods and drinks that make your symptoms worse.  Drink enough fluid to keep your pee (urine) pale yellow.   Alcohol use  Do not drink alcohol if: ? Your doctor tells you not to drink. ? You are pregnant, may be pregnant, or are planning to become pregnant.  If you drink alcohol: ? Limit your use to:  0-1 drink a day for women.  0-2 drinks a day for men. ? Be aware of how much alcohol is in your drink. In the [place] one drink equals one 12 oz bottle of beer (355 mL), one 5 oz glass of wine (148 mL), or one 1 oz glass of hard liquor (44 mL). General instructions  Talk with your doctor about ways to manage stress. You can exercise or do deep breathing, meditation, or yoga.  Do not smoke or use products that have nicotine or tobacco. If you need help quitting, ask your doctor.  Keep all follow-up visits as told by your doctor. This is important. Contact a doctor if:  Your symptoms get worse.  Your symptoms go away and then come back. Get help right away if:  You throw up blood or something that looks like coffee grounds.  You have black or dark red poop.  You throw up any time you try to drink fluids.  Your stomach pain gets worse.  You have a fever.  You do not feel better after one week. Summary  Gastritis is swelling (inflammation) of the stomach.  You must get help for this condition. If you do not get help, your stomach can bleed, and you can get sores (ulcers).  This condition is diagnosed with medical history, physical exam, or tests.  You can be treated with  medicines for germs or medicines to block too much acid in your stomach. This information is not intended to replace advice given to you by your health care provider. Make sure you discuss any questions you have with your health care provider. Document Revised: 10/14/2017 Document Reviewed: 10/14/2017 Elsevier Patient Education  2021 [employer].

## 2020-09-18 NOTE — Anesthesia Preprocedure Evaluation (Addendum)
Anesthesia Evaluation  Patient identified by MRN, date of birth, ID band Patient awake    Reviewed: Allergy & Precautions, NPO status , Patient's Chart, lab work & pertinent test results  History of Anesthesia Complications Negative for: history of anesthetic complications  Airway Mallampati: I  TM Distance: >3 FB Neck ROM: Full    Dental  (+) Dental Advisory Given, Teeth Intact   Pulmonary neg pulmonary ROS,    Pulmonary exam normal breath sounds clear to auscultation       Cardiovascular Exercise Tolerance: Good Normal cardiovascular exam Rhythm:Regular Rate:Normal  Palpitations    Neuro/Psych  Headaches, negative psych ROS   GI/Hepatic Neg liver ROS, GERD (nausea, vomiting)  ,  Endo/Other  negative endocrine ROS  Renal/GU negative Renal ROS     Musculoskeletal negative musculoskeletal ROS (+)   Abdominal   Peds  Hematology negative hematology ROS (+)   Anesthesia Other Findings   Reproductive/Obstetrics negative OB ROS                            Anesthesia Physical Anesthesia Plan  ASA: I  Anesthesia Plan: General   Post-op Pain Management:    Induction: Intravenous  PONV Risk Score and Plan: Propofol infusion  Airway Management Planned: Nasal Cannula and Natural Airway  Additional Equipment:   Intra-op Plan:   Post-operative Plan:   Informed Consent: I have reviewed the patients History and Physical, chart, labs and discussed the procedure including the risks, benefits and alternatives for the proposed anesthesia with the patient or authorized representative who has indicated his/her understanding and acceptance.     Dental advisory given  Plan Discussed with: CRNA and Surgeon  Anesthesia Plan Comments:        Anesthesia Quick Evaluation

## 2020-09-18 NOTE — Transfer of Care (Signed)
Immediate Anesthesia Transfer of Care Note  Patient: Marilyn Chapman  Procedure(s) Performed: ESOPHAGOGASTRODUODENOSCOPY (EGD) WITH PROPOFOL (N/A ) BIOPSY  Patient Location: PACU and Endoscopy Unit  Anesthesia Type:General  Level of Consciousness: awake, alert  and oriented  Airway & Oxygen Therapy: Patient Spontanous Breathing  Post-op Assessment: Report given to RN, Post -op Vital signs reviewed and stable and Patient moving all extremities X 4  Post vital signs: Reviewed and stable  Last Vitals:  Vitals Value Taken Time  BP    Temp    Pulse    Resp    SpO2      Last Pain:  Vitals:   09/18/20 1302  TempSrc:   PainSc: 8       Patients Stated Pain Goal: 10 (09/18/20 1208)  Complications: No complications documented.

## 2020-09-20 LAB — SURGICAL PATHOLOGY

## 2020-09-21 ENCOUNTER — Encounter (HOSPITAL_COMMUNITY): Payer: Self-pay | Admitting: Internal Medicine

## 2020-10-12 ENCOUNTER — Encounter: Payer: Self-pay | Admitting: Gastroenterology

## 2020-11-29 ENCOUNTER — Ambulatory Visit: Payer: BC Managed Care – PPO | Admitting: Gastroenterology

## 2021-02-18 NOTE — Progress Notes (Deleted)
Referring Provider: Richardean Chimera, MD Primary Care Physician:  Richardean Chimera, MD Primary GI Physician: Dr. Bonnetta Barry chief complaint on file.   HPI:   Marilyn Chapman is a 25 y.o. female presenting today for follow-up of epigastric pain, nausea and vomiting, diarrhea, and weight loss s/p EGD.   Patient was last seen in our office at the time of initial consult on 09/14/2020.  Reviewed information included in referral.  Noted patient was struggling with some anxiety, depression, insomnia in the setting of losing her father in September from a MI.  She is living with her mother.  She has a difficult relationship with her.  At the time of her office visit she reported PPIs help with her stomach problems, but did not resolve them.  She had daily vomiting.  Reported a weak stomach and smelling food made her nauseous causing vomiting or diarrhea.  Reported 8-10 pound weight loss over the last month.  Poor appetite.  Typically with 2-3 watery stools daily, mostly postprandial.  Reported being checked for celiac, alpha gal, and H. pylori that were all normal.  Plan included Questran 4 g 3 times daily before meals, stop Protonix and start omeprazole 40 g daily, Zofran as needed, EGD.  EGD 09/18/2020: Medium size hiatal hernia, gastritis biopsied, normal examined duodenum biopsied, esophagus biopsied.  All pathology was benign.  No H. pylori.  Today:    Past Medical History:  Diagnosis Date   Headache    Palpitations     Past Surgical History:  Procedure Laterality Date   BIOPSY  09/18/2020   Procedure: BIOPSY;  Surgeon: Lanelle Bal, DO;  Location: [facility];  Service: Endoscopy;;   cholecystectomy     ESOPHAGOGASTRODUODENOSCOPY (EGD) WITH PROPOFOL N/A 09/18/2020   Surgeon: Lanelle Bal, DO; Medium size hiatal hernia, gastritis biopsied, normal examined duodenum biopsied, esophagus biopsied.  All pathology was benign.  No H. pylori.   NO PAST SURGERIES      Current  Outpatient Medications  Medication Sig Dispense Refill   acetaminophen (TYLENOL) 500 MG tablet Take 1,000 mg by mouth every 6 (six) hours as needed for moderate pain or headache.     cholestyramine (QUESTRAN) 4 g packet Take 1 packet (4 g total) by mouth 3 (three) times daily with meals. Take other meds >1 hour before or >4 hours after Questran 90 each 2   fluticasone (FLONASE) 50 MCG/ACT nasal spray Place 1-2 sprays into both nostrils daily as needed for allergies or rhinitis.     omeprazole (PRILOSEC) 40 MG capsule Take 1 capsule (40 mg total) by mouth daily. 30 capsule 3   ondansetron (ZOFRAN ODT) 4 MG disintegrating tablet Take 1 tablet (4 mg total) by mouth every 8 (eight) hours as needed for nausea or vomiting. 30 tablet 2   pseudoephedrine (SUDAFED) 30 MG tablet Take 30 mg by mouth every 4 (four) hours as needed for congestion.     rizatriptan (MAXALT-MLT) 10 MG disintegrating tablet Take 1 tablet (10 mg total) by mouth as needed for migraine. May repeat in 2 hours if needed 9 tablet 11   SPRINTEC 28 0.25-35 MG-MCG tablet Take 1 tablet by mouth daily.     traZODone (DESYREL) 50 MG tablet Take 75 mg by mouth at bedtime.     No current facility-administered medications for this visit.    Allergies as of 02/19/2021 - Review Complete 09/18/2020  Allergen Reaction Noted   Topiramate Other (See Comments) 07/27/2019  Melatonin Itching and Rash 02/19/2016    Family History  Problem Relation Age of Onset   Hypertension Father    Hyperlipidemia Maternal Grandmother    Heart murmur Maternal Grandfather    Colon cancer Neg Hx     Social History   Socioeconomic History   Marital status: Married    Spouse name: Not on file   Number of children: 1   Years of education: 12   Highest education level: Not on file  Occupational History    Comment: home maker  Tobacco Use   Smoking status: Never   Smokeless tobacco: Never  Substance and Sexual Activity   Alcohol use: No   Drug use: No    Sexual activity: Not on file  Other Topics Concern   Not on file  Social History Narrative   Lives home with husband and child.  She is homemaker.  HS Graduate.  Drinks 7 cans soda daily.   Social Determinants of Health   Financial Resource Strain: Not on file  Food Insecurity: Not on file  Transportation Needs: Not on file  Physical Activity: Not on file  Stress: Not on file  Social Connections: Not on file    Review of Systems: Gen: Denies fever, chills, anorexia. Denies fatigue, weakness, weight loss.  CV: Denies chest pain, palpitations, syncope, peripheral edema, and claudication. Resp: Denies dyspnea at rest, cough, wheezing, coughing up blood, and pleurisy. GI: Denies vomiting blood, jaundice, and fecal incontinence.   Denies dysphagia or odynophagia. Derm: Denies rash, itching, dry skin Psych: Denies depression, anxiety, memory loss, confusion. No homicidal or suicidal ideation.  Heme: Denies bruising, bleeding, and enlarged lymph nodes.  Physical Exam: There were no vitals taken for this visit. General:   Alert and oriented. No distress noted. Pleasant and cooperative.  Head:  Normocephalic and atraumatic. Eyes:  Conjuctiva clear without scleral icterus. Mouth:  Oral mucosa pink and moist. Good dentition. No lesions. Heart:  S1, S2 present without murmurs appreciated. Lungs:  Clear to auscultation bilaterally. No wheezes, rales, or rhonchi. No distress.  Abdomen:  +BS, soft, non-tender and non-distended. No rebound or guarding. No HSM or masses noted. Msk:  Symmetrical without gross deformities. Normal posture. Extremities:  Without edema. Neurologic:  Alert and  oriented x4 Psych:  Alert and cooperative. Normal mood and affect.

## 2021-02-19 ENCOUNTER — Encounter: Payer: Self-pay | Admitting: Gastroenterology

## 2021-02-19 ENCOUNTER — Encounter: Payer: Self-pay | Admitting: Internal Medicine

## 2021-02-19 ENCOUNTER — Ambulatory Visit: Payer: BC Managed Care – PPO | Admitting: Gastroenterology

## 2022-05-16 ENCOUNTER — Encounter: Payer: Self-pay | Admitting: Diagnostic Neuroimaging

## 2022-05-16 ENCOUNTER — Ambulatory Visit (INDEPENDENT_AMBULATORY_CARE_PROVIDER_SITE_OTHER): Payer: BC Managed Care – PPO | Admitting: Diagnostic Neuroimaging

## 2022-05-16 VITALS — BP 123/78 | HR 84 | Ht 62.0 in | Wt 151.2 lb

## 2022-05-16 DIAGNOSIS — G43109 Migraine with aura, not intractable, without status migrainosus: Secondary | ICD-10-CM

## 2022-05-16 DIAGNOSIS — Z8639 Personal history of other endocrine, nutritional and metabolic disease: Secondary | ICD-10-CM | POA: Diagnosis not present

## 2022-05-16 MED ORDER — RIZATRIPTAN BENZOATE 10 MG PO TBDP: 10.0000 mg | ORAL_TABLET | ORAL | 11 refills | Status: AC | PRN

## 2022-05-16 MED ORDER — AIMOVIG 70 MG/ML ~~LOC~~ SOAJ: 70.0000 mg | SUBCUTANEOUS | 4 refills | Status: AC
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
## 2022-05-16 NOTE — Patient Instructions (Signed)
MIGRAINE WITH AURA  - start aimovig monthly injections - restart rizatriptan 10mg  as needed for breakthrough headache; may repeat x 1 after 2 hours; max 2 tabs per day or 8 per month  INTERMITTENT TREMOR / NUMBNESS of upper extremities - unclear etiology; neuro exam normal today; monitor  PINEAL CYST (likely incidental finding) - repeat MRI

## 2022-05-22 ENCOUNTER — Telehealth: Payer: Self-pay | Admitting: Diagnostic Neuroimaging

## 2022-05-22 NOTE — Telephone Encounter (Signed)
MRI order says to send to the same place as last MRI

## 2022-05-23 NOTE — Telephone Encounter (Signed)
I sent to [facility] [phone] [place] Numbers: [id number] Exp. 05/23/2022 - 06/21/2022

## 2022-06-05 NOTE — Telephone Encounter (Signed)
[facility] has left the patient 3 voice mails to schedule the MRI and she has not returned their call.

## 2022-09-16 ENCOUNTER — Telehealth: Payer: Self-pay | Admitting: Diagnostic Neuroimaging

## 2022-09-23 ENCOUNTER — Telehealth: Payer: BC Managed Care – PPO | Admitting: Diagnostic Neuroimaging
